# Patient Record
Sex: Female | Born: 1970 | Race: White | Hispanic: No | Marital: Married | Smoking: Never smoker
Health system: Southern US, Community
[De-identification: ages and names within clinical notes are randomized; demographics above are authoritative.]

## PROBLEM LIST (undated history)

## (undated) DIAGNOSIS — I10 Essential (primary) hypertension: Secondary | ICD-10-CM

## (undated) DIAGNOSIS — K5190 Ulcerative colitis, unspecified, without complications: Secondary | ICD-10-CM

## (undated) DIAGNOSIS — D649 Anemia, unspecified: Secondary | ICD-10-CM

## (undated) DIAGNOSIS — K219 Gastro-esophageal reflux disease without esophagitis: Secondary | ICD-10-CM

## (undated) HISTORY — PX: COLONOSCOPY: SHX174

## (undated) HISTORY — PX: ILEOSTOMY CLOSURE: SHX1784

## (undated) HISTORY — PX: TONSILLECTOMY: SUR1361

## (undated) HISTORY — PX: OTHER SURGICAL HISTORY: SHX169

---

## 2001-08-05 HISTORY — PX: OTHER SURGICAL HISTORY: SHX169

## 2005-01-08 ENCOUNTER — Ambulatory Visit: Payer: Self-pay | Admitting: Obstetrics and Gynecology

## 2005-06-06 ENCOUNTER — Ambulatory Visit: Payer: Self-pay | Admitting: Orthopedic Surgery

## 2005-11-22 ENCOUNTER — Ambulatory Visit: Payer: Self-pay | Admitting: Obstetrics and Gynecology

## 2005-11-29 ENCOUNTER — Ambulatory Visit: Payer: Self-pay | Admitting: Obstetrics and Gynecology

## 2007-11-08 ENCOUNTER — Ambulatory Visit: Payer: Self-pay | Admitting: Internal Medicine

## 2008-03-07 ENCOUNTER — Emergency Department: Payer: Self-pay | Admitting: Emergency Medicine

## 2008-03-07 ENCOUNTER — Other Ambulatory Visit: Payer: Self-pay

## 2008-12-09 ENCOUNTER — Ambulatory Visit: Payer: Self-pay | Admitting: Obstetrics and Gynecology

## 2010-01-10 ENCOUNTER — Ambulatory Visit: Payer: Self-pay | Admitting: Obstetrics and Gynecology

## 2011-02-05 ENCOUNTER — Ambulatory Visit: Payer: Self-pay | Admitting: Obstetrics and Gynecology

## 2011-02-08 ENCOUNTER — Ambulatory Visit: Payer: Self-pay | Admitting: Obstetrics and Gynecology

## 2011-04-21 ENCOUNTER — Ambulatory Visit: Payer: Self-pay | Admitting: Family Medicine

## 2012-02-11 ENCOUNTER — Ambulatory Visit: Payer: Self-pay | Admitting: Obstetrics and Gynecology

## 2013-02-16 ENCOUNTER — Ambulatory Visit: Payer: Self-pay | Admitting: Obstetrics and Gynecology

## 2013-06-12 ENCOUNTER — Ambulatory Visit: Payer: Self-pay | Admitting: Physician Assistant

## 2013-06-29 ENCOUNTER — Encounter: Payer: Self-pay | Admitting: Podiatry

## 2013-06-29 ENCOUNTER — Ambulatory Visit (INDEPENDENT_AMBULATORY_CARE_PROVIDER_SITE_OTHER): Payer: BC Managed Care – PPO

## 2013-06-29 ENCOUNTER — Ambulatory Visit (INDEPENDENT_AMBULATORY_CARE_PROVIDER_SITE_OTHER): Payer: BC Managed Care – PPO | Admitting: Podiatry

## 2013-06-29 VITALS — BP 149/83 | HR 80 | Resp 16 | Ht 68.0 in | Wt 170.0 lb

## 2013-06-29 DIAGNOSIS — R52 Pain, unspecified: Secondary | ICD-10-CM

## 2013-06-29 DIAGNOSIS — R609 Edema, unspecified: Secondary | ICD-10-CM

## 2013-06-29 DIAGNOSIS — S93409A Sprain of unspecified ligament of unspecified ankle, initial encounter: Secondary | ICD-10-CM

## 2013-06-29 NOTE — Patient Instructions (Signed)
Keep unna boot on for 4 days and then remove and use ace wrap at night

## 2013-06-29 NOTE — Progress Notes (Signed)
  Subjective:    Patient ID: Amber Blair, female    DOB: 12-Jul-1971, 42 y.o.   MRN: [id number]  HPI Comments: N swelling sore  L left lateral side  D 2 1/2 weeks  O dropped speaker on leg and it went down foot  C no better  A  T no treatment   Foot Injury       Review of Systems  All other systems reviewed and are negative.       Objective:   Physical Exam        Assessment & Plan:

## 2013-06-30 NOTE — Progress Notes (Signed)
Subjective:     Patient ID: Amber Blair, female   DOB: 22-Jul-1971, 42 y.o.   MRN: [id number]  Foot Injury    patient presents stating I dropped a large stereo speaker on my left foot and ankle 2 weeks ago and might ankle has been swollen and painful with ambulation. Went to urgent care who stated no fracture   Review of Systems  All other systems reviewed and are negative.       Objective:   Physical Exam  Nursing note and vitals reviewed. Constitutional: She is oriented to person, place, and time.  Cardiovascular: Intact distal pulses.   Musculoskeletal: Normal range of motion.  Neurological: She is oriented to person, place, and time.  Skin: Skin is warm.   patient is found to have normal neurovascular status with edema in the left ankle and lower leg +2 pitting in nature. I was able to put her through range of motion with only mild discomfort and there was a negative Homans sign noted. Muscle strength appears adequate with some splinting on the left side     Assessment:     Severe ankle sprain with trauma that has occurred to the left ankle    Plan:     Multiple view x-rays of foot and ankle reviewed and today Unna boot was applied with Ace wrap and surgical shoe. Levon for 4 days and less it should become too tight were she will take it off and begin Ace wrap usage. Patient to be seen back if symptoms persist or if swelling does not reduce

## 2014-01-16 ENCOUNTER — Ambulatory Visit: Payer: Self-pay | Admitting: Physician Assistant

## 2014-03-09 ENCOUNTER — Ambulatory Visit: Payer: Self-pay | Admitting: Obstetrics and Gynecology

## 2015-03-13 IMAGING — CR DG TIBIA/FIBULA 2V*L*
1 series · 2 of 2 positions shown · non-contrast
Comparison: None.

CLINICAL DATA: Pain/bruising to distal tib-fib

EXAM:
LEFT TIBIA AND FIBULA - 2 VIEW

[Series 1: ap · 0.17mm/px · 2 of 2 slices shown]
[im 1/2]
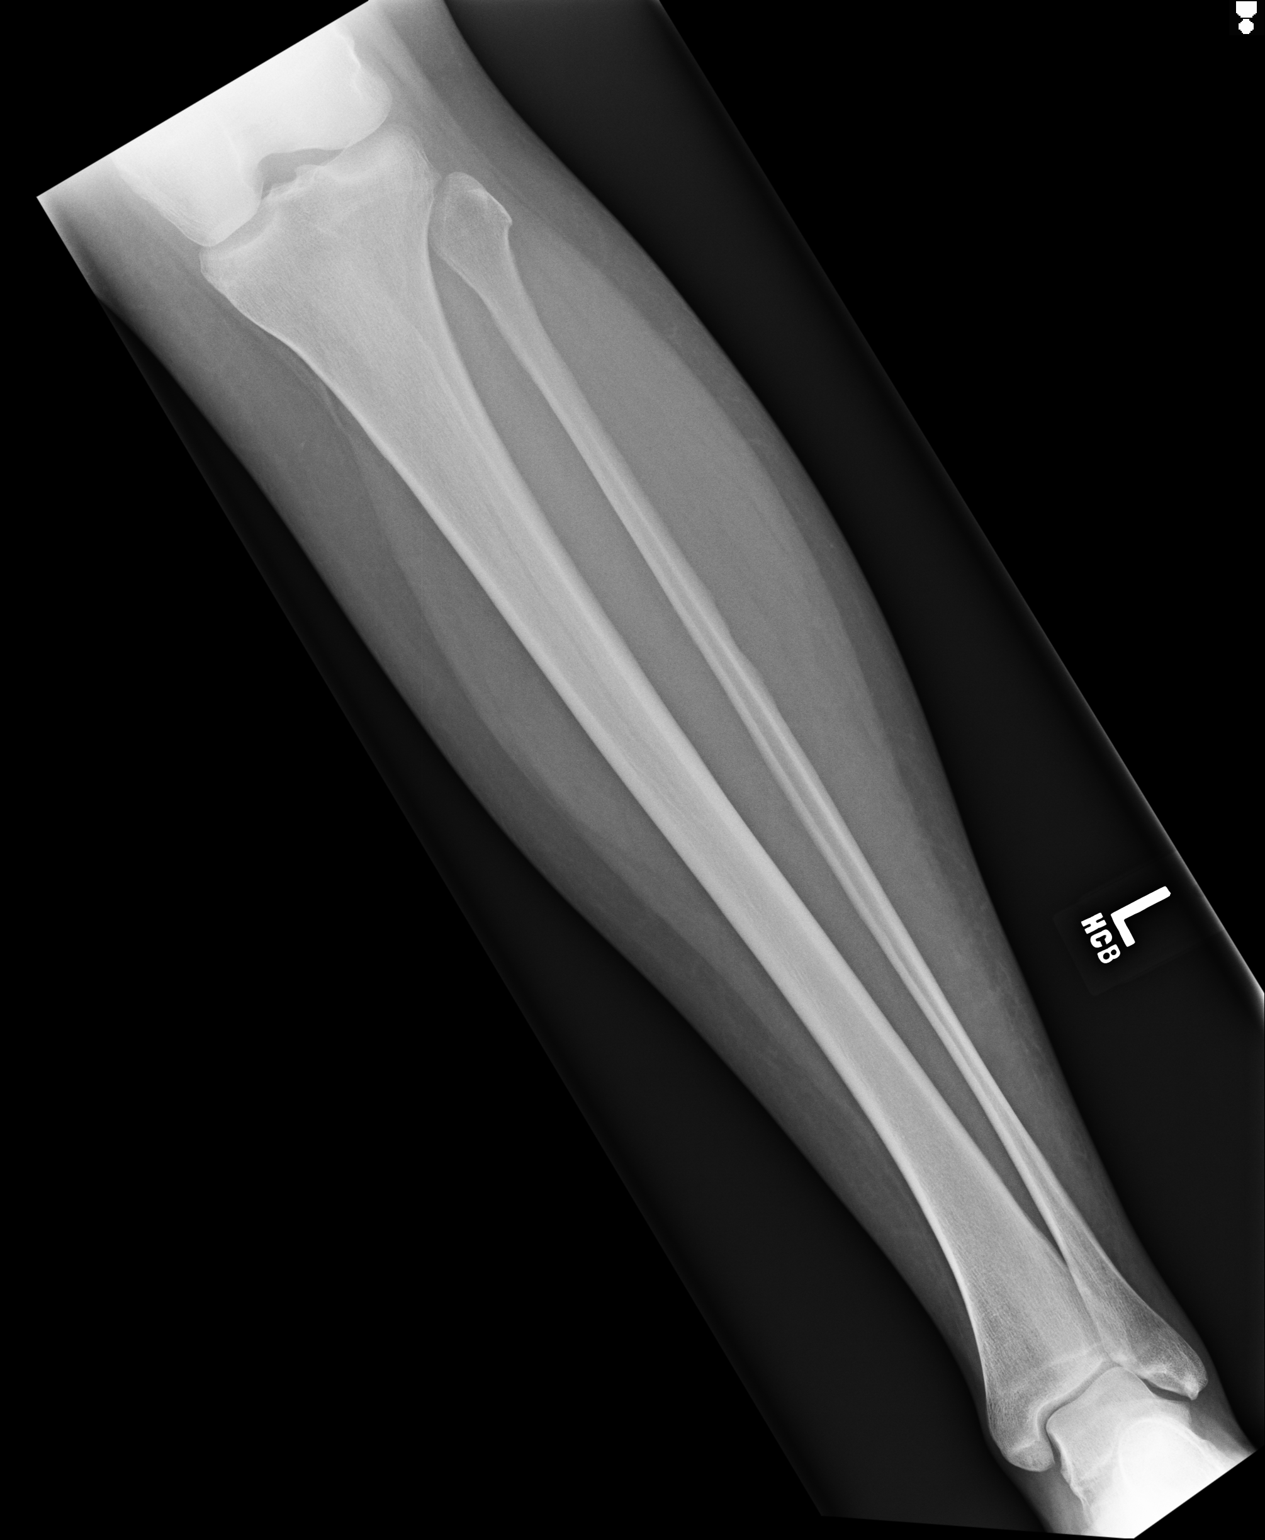
[im 2/2]
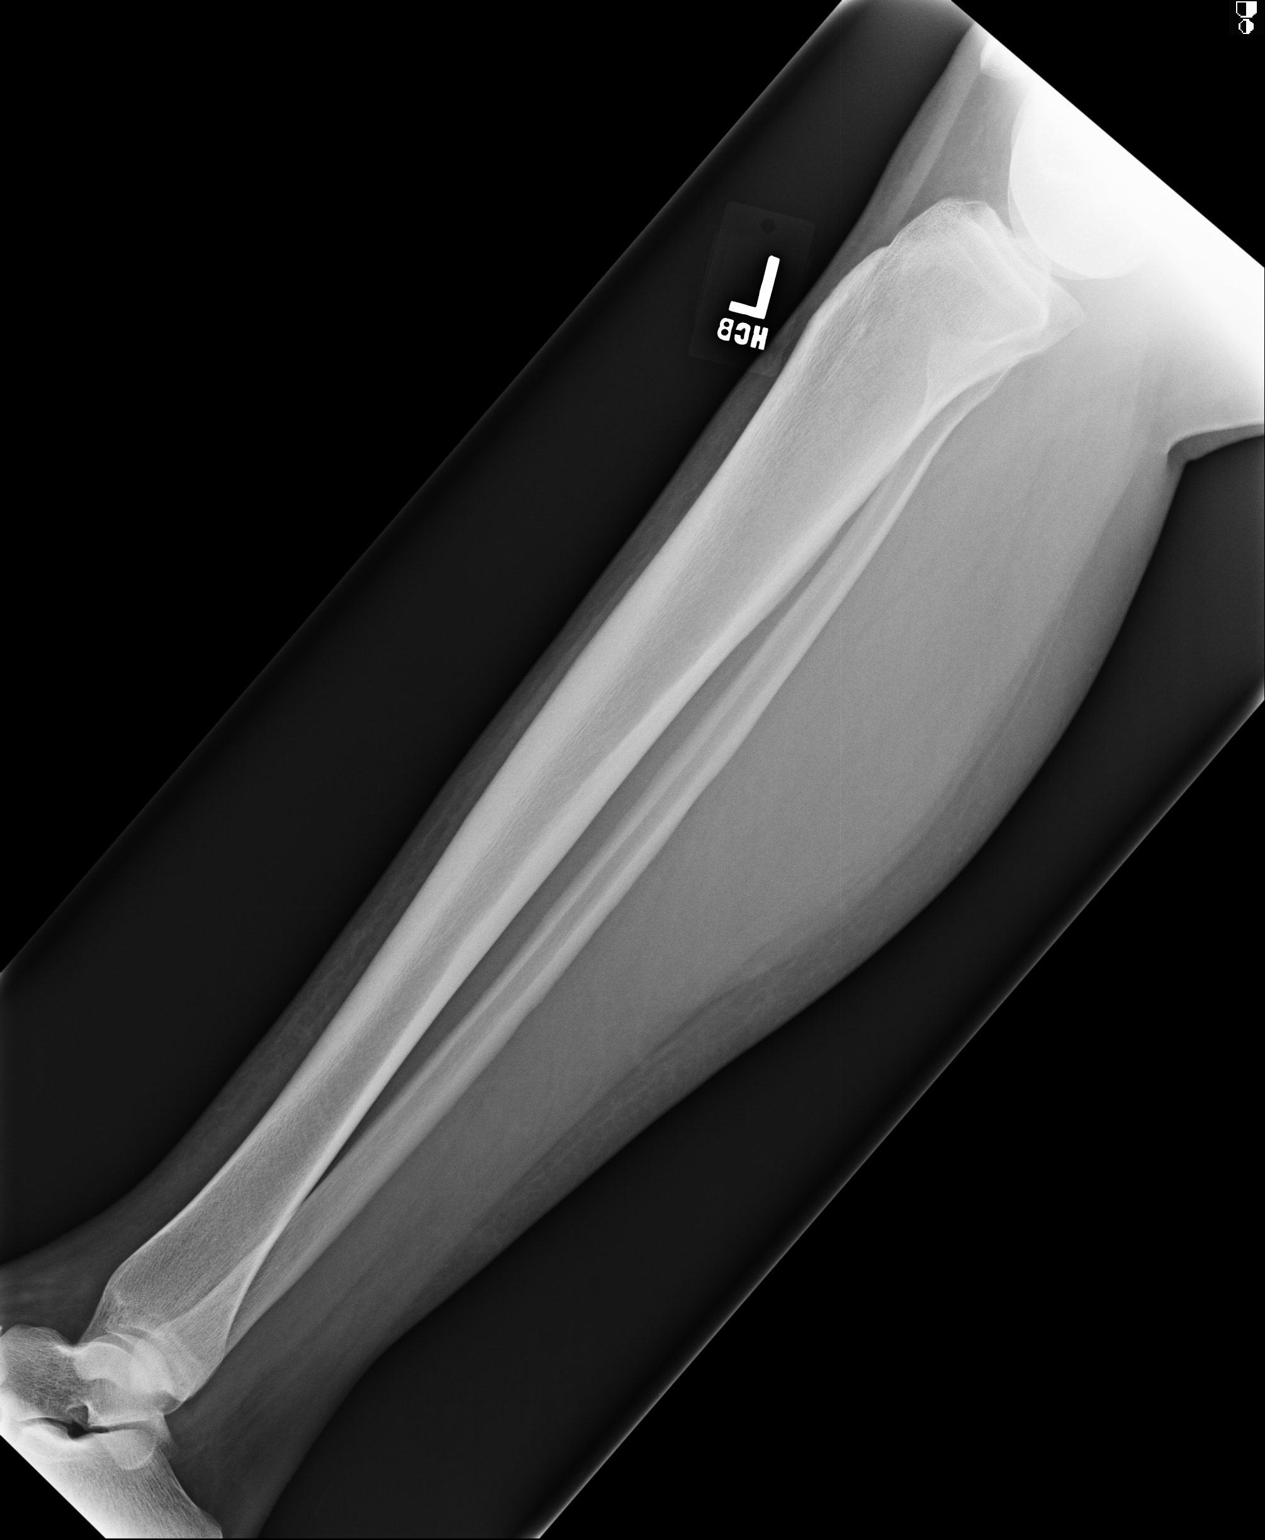

[2 of 2 positions shown; findings below may reference images not displayed]

FINDINGS: No fracture or dislocation is seen.

The joint spaces are preserved.

The visualized soft tissues are unremarkable.
IMPRESSION: No fracture or dislocation is seen.

## 2015-04-04 ENCOUNTER — Other Ambulatory Visit: Payer: Self-pay | Admitting: Obstetrics and Gynecology

## 2015-04-04 DIAGNOSIS — Z1231 Encounter for screening mammogram for malignant neoplasm of breast: Secondary | ICD-10-CM

## 2015-04-05 ENCOUNTER — Ambulatory Visit
Admission: RE | Admit: 2015-04-05 | Discharge: 2015-04-05 | Disposition: A | Discharge status: Home / Self Care | Payer: BLUE CROSS/BLUE SHIELD | Source: Ambulatory Visit | Attending: Obstetrics and Gynecology | Admitting: Obstetrics and Gynecology

## 2015-04-05 ENCOUNTER — Other Ambulatory Visit: Payer: Self-pay | Admitting: Obstetrics and Gynecology

## 2015-04-05 DIAGNOSIS — Z1231 Encounter for screening mammogram for malignant neoplasm of breast: Secondary | ICD-10-CM | POA: Insufficient documentation

## 2016-04-09 ENCOUNTER — Other Ambulatory Visit: Payer: Self-pay | Admitting: Obstetrics and Gynecology

## 2016-04-09 DIAGNOSIS — Z1231 Encounter for screening mammogram for malignant neoplasm of breast: Secondary | ICD-10-CM

## 2016-04-25 ENCOUNTER — Ambulatory Visit
Admission: RE | Admit: 2016-04-25 | Discharge: 2016-04-25 | Disposition: A | Discharge status: Home / Self Care | Payer: BLUE CROSS/BLUE SHIELD | Source: Ambulatory Visit | Attending: Obstetrics and Gynecology | Admitting: Obstetrics and Gynecology

## 2016-04-25 DIAGNOSIS — Z1231 Encounter for screening mammogram for malignant neoplasm of breast: Secondary | ICD-10-CM | POA: Insufficient documentation

## 2017-04-15 ENCOUNTER — Other Ambulatory Visit: Payer: Self-pay | Admitting: Obstetrics and Gynecology

## 2017-04-15 DIAGNOSIS — Z1231 Encounter for screening mammogram for malignant neoplasm of breast: Secondary | ICD-10-CM

## 2017-05-05 ENCOUNTER — Ambulatory Visit
Admission: RE | Admit: 2017-05-05 | Discharge: 2017-05-05 | Disposition: A | Discharge status: Home / Self Care | Payer: BLUE CROSS/BLUE SHIELD | Source: Ambulatory Visit | Attending: Obstetrics and Gynecology | Admitting: Obstetrics and Gynecology

## 2017-05-05 DIAGNOSIS — Z1231 Encounter for screening mammogram for malignant neoplasm of breast: Secondary | ICD-10-CM | POA: Insufficient documentation

## 2017-10-01 ENCOUNTER — Other Ambulatory Visit: Payer: Self-pay | Admitting: Unknown Physician Specialty

## 2017-10-01 DIAGNOSIS — M542 Cervicalgia: Secondary | ICD-10-CM

## 2017-10-06 ENCOUNTER — Ambulatory Visit
Admission: RE | Admit: 2017-10-06 | Discharge: 2017-10-06 | Disposition: A | Discharge status: Home / Self Care | Payer: BLUE CROSS/BLUE SHIELD | Source: Ambulatory Visit | Attending: Unknown Physician Specialty | Admitting: Unknown Physician Specialty

## 2017-10-06 DIAGNOSIS — M542 Cervicalgia: Secondary | ICD-10-CM

## 2017-10-06 DIAGNOSIS — R590 Localized enlarged lymph nodes: Secondary | ICD-10-CM | POA: Insufficient documentation

## 2017-10-30 ENCOUNTER — Telehealth: Payer: Self-pay

## 2017-10-31 NOTE — Telephone Encounter (Signed)
Duplicate entry

## 2018-04-21 ENCOUNTER — Other Ambulatory Visit: Payer: Self-pay | Admitting: Obstetrics and Gynecology

## 2018-04-21 DIAGNOSIS — Z1231 Encounter for screening mammogram for malignant neoplasm of breast: Secondary | ICD-10-CM

## 2018-05-06 ENCOUNTER — Ambulatory Visit: Payer: BLUE CROSS/BLUE SHIELD

## 2018-05-07 ENCOUNTER — Ambulatory Visit
Admission: RE | Admit: 2018-05-07 | Discharge: 2018-05-07 | Disposition: A | Discharge status: Home / Self Care | Payer: BLUE CROSS/BLUE SHIELD | Source: Ambulatory Visit | Attending: Obstetrics and Gynecology | Admitting: Obstetrics and Gynecology

## 2018-05-07 DIAGNOSIS — Z1231 Encounter for screening mammogram for malignant neoplasm of breast: Secondary | ICD-10-CM | POA: Insufficient documentation

## 2018-05-13 ENCOUNTER — Other Ambulatory Visit: Payer: Self-pay | Admitting: Obstetrics and Gynecology

## 2018-05-13 DIAGNOSIS — R928 Other abnormal and inconclusive findings on diagnostic imaging of breast: Secondary | ICD-10-CM

## 2018-05-13 DIAGNOSIS — N6310 Unspecified lump in the right breast, unspecified quadrant: Secondary | ICD-10-CM

## 2018-05-20 ENCOUNTER — Ambulatory Visit
Admission: RE | Admit: 2018-05-20 | Discharge: 2018-05-20 | Disposition: A | Discharge status: Home / Self Care | Payer: BLUE CROSS/BLUE SHIELD | Source: Ambulatory Visit | Attending: Obstetrics and Gynecology | Admitting: Obstetrics and Gynecology

## 2018-05-20 ENCOUNTER — Other Ambulatory Visit: Payer: Self-pay | Admitting: Obstetrics and Gynecology

## 2018-05-20 DIAGNOSIS — N6310 Unspecified lump in the right breast, unspecified quadrant: Secondary | ICD-10-CM

## 2018-05-20 DIAGNOSIS — R928 Other abnormal and inconclusive findings on diagnostic imaging of breast: Secondary | ICD-10-CM

## 2018-08-06 ENCOUNTER — Other Ambulatory Visit: Payer: Self-pay | Admitting: Obstetrics and Gynecology

## 2018-08-06 DIAGNOSIS — N6310 Unspecified lump in the right breast, unspecified quadrant: Secondary | ICD-10-CM

## 2018-08-26 ENCOUNTER — Ambulatory Visit
Admission: RE | Admit: 2018-08-26 | Discharge: 2018-08-26 | Disposition: A | Discharge status: Home / Self Care | Payer: PRIVATE HEALTH INSURANCE | Source: Ambulatory Visit | Attending: Obstetrics and Gynecology | Admitting: Obstetrics and Gynecology

## 2018-08-26 DIAGNOSIS — N6310 Unspecified lump in the right breast, unspecified quadrant: Secondary | ICD-10-CM | POA: Insufficient documentation

## 2018-09-08 ENCOUNTER — Telehealth: Payer: Self-pay

## 2018-09-08 NOTE — Telephone Encounter (Signed)
Spoke with patient reference appointment 09/24/2018. Patient states she had called to cancel this appointment she is currently working with her PCP to lower her BP. She will call if she needs another appointment with this office.  Please cancel appointment for 09/24/2018 per patient.

## 2018-09-09 NOTE — Telephone Encounter (Signed)
Cancelled appt

## 2018-09-24 ENCOUNTER — Ambulatory Visit: Payer: BLUE CROSS/BLUE SHIELD | Admitting: Cardiovascular Disease

## 2018-10-04 ENCOUNTER — Observation Stay
Admission: EM | Admit: 2018-10-04 | Discharge: 2018-10-05 | DRG: 392 | Disposition: A | Discharge status: Home / Self Care | Payer: PRIVATE HEALTH INSURANCE | Attending: Internal Medicine | Admitting: Internal Medicine

## 2018-10-04 ENCOUNTER — Other Ambulatory Visit: Payer: Self-pay

## 2018-10-04 DIAGNOSIS — N179 Acute kidney failure, unspecified: Secondary | ICD-10-CM | POA: Diagnosis not present

## 2018-10-04 DIAGNOSIS — K529 Noninfective gastroenteritis and colitis, unspecified: Secondary | ICD-10-CM

## 2018-10-04 DIAGNOSIS — E860 Dehydration: Secondary | ICD-10-CM | POA: Diagnosis present

## 2018-10-04 DIAGNOSIS — I10 Essential (primary) hypertension: Secondary | ICD-10-CM | POA: Diagnosis present

## 2018-10-04 DIAGNOSIS — R197 Diarrhea, unspecified: Secondary | ICD-10-CM | POA: Diagnosis present

## 2018-10-04 DIAGNOSIS — A0811 Acute gastroenteropathy due to Norwalk agent: Principal | ICD-10-CM | POA: Diagnosis present

## 2018-10-04 DIAGNOSIS — K5190 Ulcerative colitis, unspecified, without complications: Secondary | ICD-10-CM | POA: Diagnosis not present

## 2018-10-04 HISTORY — DX: Ulcerative colitis, unspecified, without complications: K51.90

## 2018-10-04 HISTORY — DX: Essential (primary) hypertension: I10

## 2018-10-04 LAB — GASTROINTESTINAL PANEL BY PCR, STOOL (REPLACES STOOL CULTURE)
Adenovirus F40/41: NOT DETECTED
Astrovirus: NOT DETECTED
CYCLOSPORA CAYETANENSIS: NOT DETECTED
Campylobacter species: NOT DETECTED
Cryptosporidium: NOT DETECTED
Entamoeba histolytica: NOT DETECTED
Enteroaggregative E coli (EAEC): NOT DETECTED
Enteropathogenic E coli (EPEC): NOT DETECTED
Enterotoxigenic E coli (ETEC): NOT DETECTED
Giardia lamblia: NOT DETECTED
Norovirus GI/GII: DETECTED — AB
Plesimonas shigelloides: NOT DETECTED
Rotavirus A: NOT DETECTED
Salmonella species: NOT DETECTED
Sapovirus (I, II, IV, and V): NOT DETECTED
Shiga like toxin producing E coli (STEC): NOT DETECTED
Shigella/Enteroinvasive E coli (EIEC): NOT DETECTED
VIBRIO CHOLERAE: NOT DETECTED
Vibrio species: NOT DETECTED
Yersinia enterocolitica: NOT DETECTED

## 2018-10-04 LAB — COMPREHENSIVE METABOLIC PANEL
ALT: 27 U/L (ref 0–44)
AST: 27 U/L (ref 15–41)
Albumin: 4.7 g/dL (ref 3.5–5.0)
Alkaline Phosphatase: 60 U/L (ref 38–126)
Anion gap: 16 — ABNORMAL HIGH (ref 5–15)
BUN: 41 mg/dL — ABNORMAL HIGH (ref 6–20)
CO2: 18 mmol/L — ABNORMAL LOW (ref 22–32)
CREATININE: 2.44 mg/dL — AB (ref 0.44–1.00)
Calcium: 9.6 mg/dL (ref 8.9–10.3)
Chloride: 98 mmol/L (ref 98–111)
GFR calc Af Amer: 26 mL/min — ABNORMAL LOW (ref >60)
GFR calc non Af Amer: 22 mL/min — ABNORMAL LOW (ref >60)
Glucose, Bld: 168 mg/dL — ABNORMAL HIGH (ref 70–99)
Potassium: 3.6 mmol/L (ref 3.5–5.1)
Sodium: 132 mmol/L — ABNORMAL LOW (ref 135–145)
Total Bilirubin: 0.4 mg/dL (ref 0.3–1.2)
Total Protein: 8.8 g/dL — ABNORMAL HIGH (ref 6.5–8.1)

## 2018-10-04 LAB — URINALYSIS, COMPLETE (UACMP) WITH MICROSCOPIC
Bilirubin Urine: NEGATIVE
Glucose, UA: NEGATIVE mg/dL
Ketones, ur: NEGATIVE mg/dL
NITRITE: NEGATIVE
Protein, ur: 30 mg/dL — AB
SPECIFIC GRAVITY, URINE: 1.012 (ref 1.005–1.030)
WBC, UA: >50 WBC/hpf — ABNORMAL HIGH (ref 0–5)
pH: 5 (ref 5.0–8.0)

## 2018-10-04 LAB — C DIFFICILE QUICK SCREEN W PCR REFLEX
C Diff antigen: NEGATIVE
C Diff interpretation: NOT DETECTED
C Diff toxin: NEGATIVE

## 2018-10-04 LAB — CBC
HCT: 51.3 % — ABNORMAL HIGH (ref 36.0–46.0)
Hemoglobin: 16.9 g/dL — ABNORMAL HIGH (ref 12.0–15.0)
MCH: 29.7 pg (ref 26.0–34.0)
MCHC: 32.9 g/dL (ref 30.0–36.0)
MCV: 90.2 fL (ref 80.0–100.0)
PLATELETS: 342 10*3/uL (ref 150–400)
RBC: 5.69 MIL/uL — ABNORMAL HIGH (ref 3.87–5.11)
RDW: 12.9 % (ref 11.5–15.5)
WBC: 11.1 10*3/uL — ABNORMAL HIGH (ref 4.0–10.5)
nRBC: 0 % (ref 0.0–0.2)

## 2018-10-04 LAB — LIPASE, BLOOD: Lipase: 33 U/L (ref 11–51)

## 2018-10-04 MED ORDER — POLYETHYLENE GLYCOL 3350 17 G PO PACK
17.0000 g | PACK | Freq: Every day | ORAL | Status: DC | PRN
  Filled 2018-10-04: qty 1

## 2018-10-04 MED ORDER — ACETAMINOPHEN 650 MG RE SUPP
650.0000 mg | Freq: Four times a day (QID) | RECTAL | Status: DC | PRN
  Filled 2018-10-04: qty 1

## 2018-10-04 MED ORDER — POTASSIUM CHLORIDE IN NACL 20-0.9 MEQ/L-% IV SOLN
INTRAVENOUS | Status: DC
  Administered 2018-10-04 – 2018-10-05 (×2): via INTRAVENOUS
  Filled 2018-10-04 (×5): qty 1000

## 2018-10-04 MED ORDER — HEPARIN SODIUM (PORCINE) 5000 UNIT/ML IJ SOLN
5000.0000 [IU] | Freq: Three times a day (TID) | INTRAMUSCULAR | Status: DC
  Administered 2018-10-04 – 2018-10-05 (×3): 5000 [IU] via SUBCUTANEOUS
  Filled 2018-10-04 (×6): qty 1

## 2018-10-04 MED ORDER — ONDANSETRON HCL 4 MG/2ML IJ SOLN
4.0000 mg | Freq: Once | INTRAMUSCULAR | Status: AC
  Administered 2018-10-04: 4 mg via INTRAVENOUS
  Filled 2018-10-04: qty 2

## 2018-10-04 MED ORDER — SODIUM CHLORIDE 0.9 % IV SOLN
Freq: Once | INTRAVENOUS | Status: AC
  Administered 2018-10-04: 16:00:00 via INTRAVENOUS

## 2018-10-04 MED ORDER — ACETAMINOPHEN 325 MG PO TABS
650.0000 mg | ORAL_TABLET | Freq: Four times a day (QID) | ORAL | Status: DC | PRN
  Filled 2018-10-04: qty 2

## 2018-10-04 MED ORDER — SODIUM CHLORIDE 0.9% FLUSH: 3.0000 mL | Freq: Once | INTRAVENOUS | Status: DC

## 2018-10-04 MED ORDER — ONDANSETRON HCL 4 MG PO TABS
4.0000 mg | ORAL_TABLET | Freq: Four times a day (QID) | ORAL | Status: DC | PRN
  Filled 2018-10-04: qty 1

## 2018-10-04 MED ORDER — ONDANSETRON HCL 4 MG/2ML IJ SOLN: 4.0000 mg | Freq: Four times a day (QID) | INTRAMUSCULAR | Status: DC | PRN

## 2018-10-04 MED ORDER — SODIUM CHLORIDE 0.9 % IV SOLN
Freq: Once | INTRAVENOUS | Status: AC
  Administered 2018-10-04: 18:00:00 via INTRAVENOUS

## 2018-10-04 NOTE — ED Provider Notes (Signed)
[facility] Provider Note       Time seen: ----------------------------------------- 4:18 PM on 10/04/2018 -----------------------------------------   I have reviewed the triage vital signs and the nursing notes.  HISTORY   Chief Complaint Emesis and Diarrhea    HPI Amber Blair is a 48 y.o. female with a history of hypertension and ulcerative colitis who presents to the ED for persistent vomiting and diarrhea that started on Friday.  She states she has not vomited since Friday but has had profuse diarrhea with one episode around every hour.  She is concerned she may have food poisoning.  She does ache all over and cramp, is concerned she is dehydrated.  She also feels near syncopal.  Past Medical History:  Diagnosis Date  . Hypertension   . Ulcerative colitis (HCC)     There are no active problems to display for this patient.   Past Surgical History:  Procedure Laterality Date  . large intestine removed      Allergies Patient has no known allergies.  Social History Social History   Tobacco Use  . Smoking status: Never Smoker  . Smokeless tobacco: Never Used  Substance Use Topics  . Alcohol use: No  . Drug use: No   Review of Systems Constitutional: Negative for fever. Cardiovascular: Negative for chest pain. Respiratory: Negative for shortness of breath. Gastrointestinal: Negative for abdominal pain, positive for recent vomiting, severe diarrhea Musculoskeletal: Negative for back pain. Skin: Negative for rash. Neurological: Positive for weakness  All systems negative/normal/unremarkable except as stated in the HPI  ____________________________________________   PHYSICAL EXAM:  VITAL SIGNS: ED Triage Vitals  Enc Vitals Group     BP 10/04/18 1431 (!) 104/54     Pulse Rate 10/04/18 1431 (!) 129     Resp --      Temp --      Temp src --      SpO2 10/04/18 1431 100 %     Weight 10/04/18 1432 170  lb (77.1 kg)     Height 10/04/18 1432 5\' 8"  (1.727 m)     Head Circumference --      Peak Flow --      Pain Score 10/04/18 1446 7     Pain Loc --      Pain Edu? --      Excl. in GC? --    Constitutional: Alert and oriented.  Mild distress Eyes: Conjunctivae are normal. Normal extraocular movements. ENT      Head: Normocephalic and atraumatic.      Nose: No congestion/rhinnorhea.      Mouth/Throat: Mucous membranes are dry      Neck: No stridor. Cardiovascular: Normal rate, regular rhythm. No murmurs, rubs, or gallops. Respiratory: Normal respiratory effort without tachypnea nor retractions. Breath sounds are clear and equal bilaterally. No wheezes/rales/rhonchi. Gastrointestinal: Soft and nontender. Normal bowel sounds Musculoskeletal: Nontender with normal range of motion in extremities. No lower extremity tenderness nor edema. Neurologic:  Normal speech and language. No gross focal neurologic deficits are appreciated.  Skin:  Skin is warm, dry and intact. No rash noted. Psychiatric: Mood and affect are normal. Speech and behavior are normal.  ____________________________________________  ED COURSE:  As part of my medical decision making, I reviewed the following data within the electronic MEDICAL RECORD NUMBER History obtained from family if available, nursing notes, old chart and ekg, as well as notes from prior ED visits. Patient presented for likely gastroenteritis and dehydration, we will assess  with labs and imaging as indicated at this time.   Procedures ____________________________________________   LABS (pertinent positives/negatives)  Labs Reviewed  COMPREHENSIVE METABOLIC PANEL - Abnormal; Notable for the following components:      Result Value   Sodium 132 (*)    CO2 18 (*)    Glucose, Bld 168 (*)    BUN 41 (*)    Creatinine, Ser 2.44 (*)    Total Protein 8.8 (*)    GFR calc non Af Amer 22 (*)    GFR calc Af Amer 26 (*)    Anion gap 16 (*)    All other  components within normal limits  CBC - Abnormal; Notable for the following components:   WBC 11.1 (*)    RBC 5.69 (*)    Hemoglobin 16.9 (*)    HCT 51.3 (*)    All other components within normal limits  GASTROINTESTINAL PANEL BY PCR, STOOL (REPLACES STOOL CULTURE)  C DIFFICILE QUICK SCREEN W PCR REFLEX  LIPASE, BLOOD  URINALYSIS, COMPLETE (UACMP) WITH MICROSCOPIC   ____________________________________________   DIFFERENTIAL DIAGNOSIS   Dehydration, electrolyte abnormality, gastroenteritis, renal failure  FINAL ASSESSMENT AND PLAN  Gastroenteritis, acute renal failure   Plan: The patient had presented for persistent diarrhea and dehydration. Patient's labs did indicate significant dehydration and acute renal failure likely from same.  Patient was started on 2 L of IV fluid as well as IV antiemetics.  She is not currently having any pain except for cramping from dehydration.  I will discuss with the hospitalist for admission.   Amber Dash, MD    Note: This note was generated in part or whole with voice recognition software. Voice recognition is usually quite accurate but there are transcription errors that can and very often do occur. I apologize for any typographical errors that were not detected and corrected.     Emily Filbert, MD 10/04/18 1620

## 2018-10-04 NOTE — H&P (Signed)
[facility] [place] NAME: Amber Blair    MR#:  [id number]  DATE OF BIRTH:  10-10-1970  DATE OF ADMISSION:  10/04/2018  PRIMARY CARE PHYSICIAN: Dayton Martes Victoriano Lain, NP   REQUESTING/REFERRING PHYSICIAN:  Dr Jimmye Norman  CHIEF COMPLAINT:   diarrhea HISTORY OF PRESENT ILLNESS:  Amber Blair  is a 48 y.o. female with a known history of hypertension who presents emergency room due to diarrhea.  Patient reports over the past 2 days she has had diarrhea, nausea and vomiting.  She felt very dehydrated and was having leg cramps so she comes to the ER for further evaluation.  In the emergency room it is noted that his creatinine is elevated.  Patient reports that she was in the hospital at [facility] with her daughter over the weekend.  She reports no antibiotics, fever, chills.  She denies abdominal pain.  No sick contacts noted.  No recent travel history.    PAST MEDICAL HISTORY:   Past Medical History:  Diagnosis Date  . Hypertension   . Ulcerative colitis ([place])     PAST SURGICAL HISTORY:   Past Surgical History:  Procedure Laterality Date  . large intestine removed      SOCIAL HISTORY:   Social History   Tobacco Use  . Smoking status: Never Smoker  . Smokeless tobacco: Never Used  Substance Use Topics  . Alcohol use: No    FAMILY HISTORY:   Family History  Problem Relation Age of Onset  . Breast cancer Paternal Grandmother 11    DRUG ALLERGIES:  No Known Allergies  REVIEW OF SYSTEMS:   Review of Systems  Constitutional: Positive for malaise/fatigue. Negative for chills and fever.  HENT: Negative.  Negative for ear discharge, ear pain, hearing loss, nosebleeds and sore throat.   Eyes: Negative.  Negative for blurred vision and pain.  Respiratory: Negative.  Negative for cough, hemoptysis, shortness of breath and wheezing.   Cardiovascular: Negative.  Negative for chest pain, palpitations and leg swelling.   Gastrointestinal: Positive for diarrhea, nausea and vomiting. Negative for abdominal pain and blood in stool.  Genitourinary: Negative.  Negative for dysuria.  Musculoskeletal: Negative.  Negative for back pain.  Skin: Negative.   Neurological: Negative for dizziness, tremors, speech change, focal weakness, seizures and headaches.  Endo/Heme/Allergies: Negative.  Does not bruise/bleed easily.  Psychiatric/Behavioral: Negative.  Negative for depression, hallucinations and suicidal ideas.    MEDICATIONS AT HOME:   Losartan 100 mg daily HCTZ 12.5 mg daily Vitamin D3 5000 units daily Birth control  VITAL SIGNS:  Blood pressure (!) 104/54, pulse (!) 111, height 5' 8"  (1.727 m), weight 77.1 kg, SpO2 100 %.  PHYSICAL EXAMINATION:   Physical Exam Constitutional:      General: She is not in acute distress. HENT:     Head: Normocephalic.     Mouth/Throat:     Mouth: Mucous membranes are dry.  Eyes:     General: No scleral icterus. Neck:     Musculoskeletal: Normal range of motion and neck supple.     Vascular: No JVD.     Trachea: No tracheal deviation.  Cardiovascular:     Rate and Rhythm: Normal rate and regular rhythm.     Heart sounds: Normal heart sounds. No murmur. No friction rub. No gallop.   Pulmonary:     Effort: Pulmonary effort is normal. No respiratory distress.     Breath sounds: Normal breath sounds. No wheezing or rales.  Chest:     Chest wall: No tenderness.  Abdominal:     General: Bowel sounds are normal. There is no distension.     Palpations: Abdomen is soft. There is no mass.     Tenderness: There is no abdominal tenderness. There is no guarding or rebound.  Musculoskeletal: Normal range of motion.  Skin:    General: Skin is warm.     Findings: No erythema or rash.  Neurological:     Mental Status: She is alert and oriented to person, place, and time.  Psychiatric:        Judgment: Judgment normal.       LABORATORY PANEL:   CBC Recent Labs   Lab 10/04/18 1449  WBC 11.1*  HGB 16.9*  HCT 51.3*  PLT 342   ------------------------------------------------------------------------------------------------------------------  Chemistries  Recent Labs  Lab 10/04/18 1449  NA 132*  K 3.6  CL 98  CO2 18*  GLUCOSE 168*  BUN 41*  CREATININE 2.44*  CALCIUM 9.6  AST 27  ALT 27  ALKPHOS 60  BILITOT 0.4   ------------------------------------------------------------------------------------------------------------------  Cardiac Enzymes No results for input(s): TROPONINI in the last 168 hours. ------------------------------------------------------------------------------------------------------------------  RADIOLOGY:  No results found.  EKG:   Orders placed or performed in visit on 03/07/08  . EKG 12-Lead    IMPRESSION AND PLAN:   48 year old female with a history of hypertension who presents with diarrhea.  1.  Acute kidney injury in the setting of diarrhea: Start IV fluids BMP for a.m. Hold losartan and HCTZ as these have renal effects.  2.  Essential hypertension: Hold blood pressure medications for now as these will affect kidney function  3.  Diarrhea with nausea and vomiting: Likely viral in nature GI panel pending C. difficile pending      All the records are reviewed and case discussed with ED provider. Management plans discussed with the patient and she is in agreement  CODE STATUS: full  TOTAL TIME TAKING CARE OF THIS PATIENT: 41 minutes.    Rosita Guzzetta M.D on 10/04/2018 at 4:33 PM  Between 7am to 6pm - Pager - [phone]  After 6pm go to [email or web address] - password [email or web address]  Office  [phone]  CC: Primary care physician; Dayton Martes Victoriano Lain, NP

## 2018-10-04 NOTE — ED Triage Notes (Addendum)
Pt comes via POV from home with c/o vomiting and diarrhea that started Friday night. Pt states possible food poisoning. Pt states this all started not long after eating.  Pt states she aches all over. Pt states hot flashes and cramps in her feet. Pt states she feels like she is going to pass out.  Pt states last vomiting Friday night. Pt states decreased oral intake.  Pt also states she was recently at [facility] with daughter and spent the night and could have been exposed to something there.

## 2018-10-05 LAB — CBC
HCT: 44.4 % (ref 36.0–46.0)
Hemoglobin: 14.2 g/dL (ref 12.0–15.0)
MCH: 29.7 pg (ref 26.0–34.0)
MCHC: 32 g/dL (ref 30.0–36.0)
MCV: 92.9 fL (ref 80.0–100.0)
Platelets: 300 10*3/uL (ref 150–400)
RBC: 4.78 MIL/uL (ref 3.87–5.11)
RDW: 13.1 % (ref 11.5–15.5)
WBC: 7.8 10*3/uL (ref 4.0–10.5)
nRBC: 0 % (ref 0.0–0.2)

## 2018-10-05 LAB — BASIC METABOLIC PANEL
Anion gap: 7 (ref 5–15)
BUN: 32 mg/dL — AB (ref 6–20)
CO2: 24 mmol/L (ref 22–32)
Calcium: 8.2 mg/dL — ABNORMAL LOW (ref 8.9–10.3)
Chloride: 107 mmol/L (ref 98–111)
Creatinine, Ser: 1.19 mg/dL — ABNORMAL HIGH (ref 0.44–1.00)
GFR calc Af Amer: >60 mL/min (ref >60)
GFR calc non Af Amer: 54 mL/min — ABNORMAL LOW (ref >60)
Glucose, Bld: 100 mg/dL — ABNORMAL HIGH (ref 70–99)
Potassium: 3.5 mmol/L (ref 3.5–5.1)
Sodium: 138 mmol/L (ref 135–145)

## 2018-10-05 MED ORDER — ONDANSETRON HCL 4 MG PO TABS: 4.0000 mg | ORAL_TABLET | Freq: Four times a day (QID) | ORAL | 0 refills | Status: DC | PRN

## 2018-10-05 MED ORDER — VITAMIN D3 25 MCG (1000 UNIT) PO TABS
5000.0000 [IU] | ORAL_TABLET | Freq: Every day | ORAL | Status: DC
  Filled 2018-10-05: qty 5

## 2018-10-05 NOTE — ED Notes (Signed)
Patient in bathroom at this time.

## 2018-10-05 NOTE — Progress Notes (Signed)
MD notified of GI panel results. Continue Contact precautions '

## 2018-10-05 NOTE — Discharge Summary (Signed)
[facility] [place] at [facility]   PATIENT NAME: Amber Blair    MR#:  [id number]  DATE OF BIRTH:  [birth year]  DATE OF ADMISSION:  10/04/2018   ADMITTING PHYSICIAN: Adrian Saran, MD  DATE OF DISCHARGE: 10/05/2018  3:00 PM  PRIMARY CARE PHYSICIAN: Bayard Males Hermenia Fiscal, NP   ADMISSION DIAGNOSIS:  dehydrated diarrhea DISCHARGE DIAGNOSIS:  Active Problems:   Acute kidney injury (HCC)  SECONDARY DIAGNOSIS:   Past Medical History:  Diagnosis Date  . Hypertension   . Ulcerative colitis [facility])    HOSPITAL COURSE:   Assetou is a 48 year old female who presented to the ED with nausea, vomiting, and diarrhea.  She also felt very dehydrated.  In the ED, creatinine was elevated to 2.44.  She was admitted for further management.  GI pathogen panel was positive for norovirus.  She received IV fluids and IV antiemetics.  Her creatinine improved to 1.19 on the day of discharge.  Losartan and HCTZ were held and patient should restart these in 1 day.  Her nausea and vomiting resolved.  She was discharged home with close PCP follow-up.  DISCHARGE CONDITIONS:  Hypertension Ulcerative colitis Norovirus infection CONSULTS OBTAINED:  None DRUG ALLERGIES:  No Known Allergies DISCHARGE MEDICATIONS:   Allergies as of 10/05/2018   No Known Allergies     Medication List    TAKE these medications   hydrochlorothiazide 12.5 MG tablet Commonly known as:  HYDRODIURIL Take 12.5 mg by mouth daily.   JUNEL FE 1/20 1-20 MG-MCG tablet Generic drug:  norethindrone-ethinyl estradiol   losartan 100 MG tablet Commonly known as:  COZAAR Take 100 mg by mouth daily.   ondansetron 4 MG tablet Commonly known as:  ZOFRAN Take 1 tablet (4 mg total) by mouth every 6 (six) hours as needed for nausea.   Vitamin D3 125 MCG (5000 UT) Tabs Take 5,000 Units by mouth daily.        DISCHARGE INSTRUCTIONS:  1.  Follow-up with PCP in 5 days 2.  Encouraged p.o. intake DIET:  Cardiac  diet DISCHARGE CONDITION:  Stable ACTIVITY:  Activity as tolerated OXYGEN:  Home Oxygen: No.  Oxygen Delivery: room air DISCHARGE LOCATION:  home   If you experience worsening of your admission symptoms, develop shortness of breath, life threatening emergency, suicidal or homicidal thoughts you must seek medical attention immediately by calling 911 or calling your MD immediately  if symptoms less severe.  You Must read complete instructions/literature along with all the possible adverse reactions/side effects for all the Medicines you take and that have been prescribed to you. Take any new Medicines after you have completely understood and accpet all the possible adverse reactions/side effects.   Please note  You were cared for by a hospitalist during your hospital stay. If you have any questions about your discharge medications or the care you received while you were in the hospital after you are discharged, you can call the unit and asked to speak with the hospitalist on call if the hospitalist that took care of you is not available. Once you are discharged, your primary care physician will handle any further medical issues. Please note that NO REFILLS for any discharge medications will be authorized once you are discharged, as it is imperative that you return to your primary care physician (or establish a relationship with a primary care physician if you do not have one) for your aftercare needs so that they can reassess your need for medications and monitor your  lab values.    On the day of Discharge:  VITAL SIGNS:  Blood pressure 114/83, pulse 82, temperature 98 F (36.7 C), temperature source Oral, resp. rate 16, height 5\' 8"  (1.727 m), weight 77.1 kg, SpO2 100 %. PHYSICAL EXAMINATION:  GENERAL:  48 y.o.-year-old patient lying in the bed with no acute distress.  EYES: Pupils equal, round, reactive to light and accommodation. No scleral icterus. Extraocular muscles intact.  HEENT:  Head atraumatic, normocephalic. Oropharynx and nasopharynx clear.  NECK:  Supple, no jugular venous distention. No thyroid enlargement, no tenderness.  LUNGS: Normal breath sounds bilaterally, no wheezing, rales,rhonchi or crepitation. No use of accessory muscles of respiration.  CARDIOVASCULAR: RRR, S1, S2 normal. No murmurs, rubs, or gallops.  ABDOMEN: Soft, non-distended. Bowel sounds present. No organomegaly or mass. + Very mild diffuse tenderness to palpation, no rebound or guarding. EXTREMITIES: No pedal edema, cyanosis, or clubbing.  NEUROLOGIC: Cranial nerves II through XII are intact. Muscle strength 5/5 in all extremities. Sensation intact. Gait not checked.  PSYCHIATRIC: The patient is alert and oriented x 3.  SKIN: No obvious rash, lesion, or ulcer.  DATA REVIEW:   CBC Recent Labs  Lab 10/05/18 0429  WBC 7.8  HGB 14.2  HCT 44.4  PLT 300    Chemistries  Recent Labs  Lab 10/04/18 1449 10/05/18 0429  NA 132* 138  K 3.6 3.5  CL 98 107  CO2 18* 24  GLUCOSE 168* 100*  BUN 41* 32*  CREATININE 2.44* 1.19*  CALCIUM 9.6 8.2*  AST 27  --   ALT 27  --   ALKPHOS 60  --   BILITOT 0.4  --      Microbiology Results  Results for orders placed or performed during the hospital encounter of 10/04/18  C difficile quick scan w PCR reflex     Status: None   Collection Time: 10/04/18  4:57 PM  Result Value Ref Range Status   C Diff antigen NEGATIVE NEGATIVE Final   C Diff toxin NEGATIVE NEGATIVE Final   C Diff interpretation No C. difficile detected.  Final    Comment: Performed at [facility], [address] [place], [place] [address]  Gastrointestinal Panel by PCR , Stool     Status: Abnormal   Collection Time: 10/04/18  4:57 PM  Result Value Ref Range Status   Campylobacter species NOT DETECTED NOT DETECTED Final   Plesimonas shigelloides NOT DETECTED NOT DETECTED Final   Salmonella species NOT DETECTED NOT DETECTED Final   Yersinia enterocolitica NOT  DETECTED NOT DETECTED Final   Vibrio species NOT DETECTED NOT DETECTED Final   Vibrio cholerae NOT DETECTED NOT DETECTED Final   Enteroaggregative E coli (EAEC) NOT DETECTED NOT DETECTED Final   Enteropathogenic E coli (EPEC) NOT DETECTED NOT DETECTED Final   Enterotoxigenic E coli (ETEC) NOT DETECTED NOT DETECTED Final   Shiga like toxin producing E coli (STEC) NOT DETECTED NOT DETECTED Final   Shigella/Enteroinvasive E coli (EIEC) NOT DETECTED NOT DETECTED Final   Cryptosporidium NOT DETECTED NOT DETECTED Final   Cyclospora cayetanensis NOT DETECTED NOT DETECTED Final   Entamoeba histolytica NOT DETECTED NOT DETECTED Final   Giardia lamblia NOT DETECTED NOT DETECTED Final   Adenovirus F40/41 NOT DETECTED NOT DETECTED Final   Astrovirus NOT DETECTED NOT DETECTED Final   Norovirus GI/GII DETECTED (A) NOT DETECTED Final    Comment: RESULT CALLED TO, READ BACK BY AND VERIFIED WITH: Montel ClockKIERRA TORAIN @2355  10/04/18 AKT    Rotavirus A NOT  DETECTED NOT DETECTED Final   Sapovirus (I, II, IV, and V) NOT DETECTED NOT DETECTED Final    Comment: Performed at [facility], [address] [place], [place] [address]    RADIOLOGY:  No results found.   Management plans discussed with the patient, family and they are in agreement.  CODE STATUS: Full Code   TOTAL TIME TAKING CARE OF THIS PATIENT: 45 minutes.    Jinny Blossom Geovanie Winnett M.D on 10/05/2018 at 4:47 PM  Between 7am to 6pm - Pager - [phone]  After 6pm go to [email or web address] - Social research officer, government  [facility]  Office  [phone]  CC: Primary care physician; Myrene Buddy, NP   Note: This dictation was prepared with Dragon dictation along with smaller phrase technology. Any transcriptional errors that result from this process are unintentional.

## 2018-10-05 NOTE — ED Notes (Signed)
Pt states that she feels like she has a UTI, she states that this is a new sensation that began this am.  She reports pain when she voids.  Will notify EDP

## 2018-10-05 NOTE — ED Notes (Signed)
Pt ambulating to to restroom , no distress noted

## 2018-10-05 NOTE — Discharge Instructions (Signed)
It was so nice to meet you during this hospitalization!  You came into the hospital with nausea, vomiting, and diarrhea. You tested positive for norovirus. We gave you IV fluids to help rehydrate you. Please drink plenty of fluids when you get home.  I sent in a prescription for zofran to your pharmacy that you can use as needed for nausea and vomiting.  Take care, Dr. Nancy Marus

## 2018-10-05 NOTE — ED Notes (Signed)
Pt ambulates to the bathroom.  Call to lab to add urine culture to previously sent urine.

## 2018-10-05 NOTE — ED Notes (Signed)
Pt would like to stay in the hospital, she is concerned about getting her family sick and also notes that she does not want to go back to feeling the way she has been.  MD was notified

## 2018-10-06 LAB — URINE CULTURE

## 2018-10-06 LAB — HIV ANTIBODY (ROUTINE TESTING W REFLEX): HIV Screen 4th Generation wRfx: NONREACTIVE

## 2018-11-23 ENCOUNTER — Other Ambulatory Visit: Payer: BLUE CROSS/BLUE SHIELD

## 2019-05-21 ENCOUNTER — Other Ambulatory Visit: Payer: Self-pay | Admitting: Obstetrics & Gynecology

## 2019-05-21 DIAGNOSIS — Z87898 Personal history of other specified conditions: Secondary | ICD-10-CM

## 2019-05-21 DIAGNOSIS — N6310 Unspecified lump in the right breast, unspecified quadrant: Secondary | ICD-10-CM

## 2019-06-17 ENCOUNTER — Ambulatory Visit
Admission: RE | Admit: 2019-06-17 | Discharge: 2019-06-17 | Disposition: A | Discharge status: Home / Self Care | Payer: PRIVATE HEALTH INSURANCE | Source: Ambulatory Visit | Attending: Obstetrics & Gynecology | Admitting: Obstetrics & Gynecology

## 2019-06-17 DIAGNOSIS — N6310 Unspecified lump in the right breast, unspecified quadrant: Secondary | ICD-10-CM | POA: Diagnosis present

## 2019-06-17 DIAGNOSIS — Z87898 Personal history of other specified conditions: Secondary | ICD-10-CM

## 2019-08-06 HISTORY — PX: CHOLECYSTECTOMY: SHX55

## 2020-05-12 ENCOUNTER — Other Ambulatory Visit (HOSPITAL_COMMUNITY): Payer: Self-pay | Admitting: Podiatry

## 2020-05-12 ENCOUNTER — Other Ambulatory Visit: Payer: Self-pay | Admitting: Podiatry

## 2020-05-12 DIAGNOSIS — R600 Localized edema: Secondary | ICD-10-CM

## 2020-05-16 ENCOUNTER — Ambulatory Visit
Admission: RE | Admit: 2020-05-16 | Discharge: 2020-05-16 | Disposition: A | Discharge status: Home / Self Care | Payer: Self-pay | Source: Ambulatory Visit | Attending: Podiatry | Admitting: Podiatry

## 2020-05-16 ENCOUNTER — Other Ambulatory Visit: Payer: Self-pay

## 2020-05-16 DIAGNOSIS — R600 Localized edema: Secondary | ICD-10-CM | POA: Insufficient documentation

## 2020-06-13 ENCOUNTER — Ambulatory Visit
Admission: RE | Admit: 2020-06-13 | Discharge: 2020-06-13 | Disposition: A | Discharge status: Home / Self Care | Payer: 59 | Source: Ambulatory Visit | Attending: Nurse Practitioner | Admitting: Nurse Practitioner

## 2020-06-13 ENCOUNTER — Other Ambulatory Visit: Payer: Self-pay

## 2020-06-13 ENCOUNTER — Other Ambulatory Visit: Payer: Self-pay | Admitting: Nurse Practitioner

## 2020-06-13 DIAGNOSIS — R1011 Right upper quadrant pain: Secondary | ICD-10-CM | POA: Insufficient documentation

## 2020-06-16 ENCOUNTER — Ambulatory Visit: Payer: Self-pay | Admitting: General Surgery

## 2020-06-16 ENCOUNTER — Encounter: Payer: Self-pay | Admitting: General Surgery

## 2020-06-16 NOTE — Telephone Encounter (Signed)
This encounter was created in error - please disregard.

## 2020-06-16 NOTE — H&P (Signed)
PATIENT PROFILE: Amber Blair is a 49 y.o. female who presents to the Clinic for consultation at the request of Dr. Dayton Martes for evaluation of cholelithiasis.  PCP:  Sallee Lange, NP  HISTORY OF PRESENT ILLNESS: Ms. Flow reports she had an episode of epigastric discomfort and indigestion 1 week ago.  She reported that it came after she ate hot dog with chili.  She developed diarrhea and right upper quadrant pain.  Right upper current pain exacerbated by palpation.  There has been no alleviating factors.  The pain has been slowly improving but still present.  She went to see her primary care physician on Monday and ultrasound was ordered.  Abdominal ultrasound showed a distended gallbladder without gallbladder wall thickening and there was presence of gallstones.  There was no pericholecystic fluid.  There was negative Murphy sign.  Also labs were ordered without leukocytosis.  Liver enzymes and bilirubin within normal limits.  I personally evaluated the images of the ultrasound and I reviewed the labs.   PROBLEM LIST:         Problem List  Date Reviewed: 06/13/2020         Noted   Situational mixed anxiety and depressive disorder 03/14/2020   Chronic pain of left knee 11/15/2019   Chronic pain of right knee 11/15/2019   Ulcerative colitis (CMS-HCC) Unknown   Seasonal allergies Unknown   Impaired glucose tolerance 04/21/2018   Overview    HgbA1c 5.8% 04/21/2018      Vitamin D deficiency 04/21/2018   Overview    25-OH vitamin D level 25.5 on 04/21/2018      Mixed hyperlipidemia 04/12/2016   Benign essential HTN 04/12/2016      GENERAL REVIEW OF SYSTEMS:   General ROS: negative for - chills, fatigue, fever, weight gain or weight loss Allergy and Immunology ROS: negative for - hives  Hematological and Lymphatic ROS: negative for - bleeding problems or bruising, negative for palpable nodes Endocrine ROS: negative for - heat or cold intolerance, hair  changes Respiratory ROS: negative for - cough, shortness of breath or wheezing Cardiovascular ROS: no chest pain or palpitations GI ROS: negative for nausea, vomiting, constipation.  Positive for abdominal pain and diarrhea Musculoskeletal ROS: negative for - joint swelling or muscle pain Neurological ROS: negative for - confusion, syncope Dermatological ROS: negative for pruritus and rash Psychiatric: negative for anxiety, depression, difficulty sleeping and memory loss  MEDICATIONS: Current Medications        Current Outpatient Medications  Medication Sig Dispense Refill  . cholecalciferol, vitamin D3, (VITAMIN D3) 5,000 unit Tab Take 1 tablet by mouth once daily    . fexofenadine (ALLEGRA) 180 MG tablet Take 180 mg by mouth once daily    . hydroCHLOROthiazide (HYDRODIURIL) 12.5 MG tablet TAKE 1 TABLET BY MOUTH EVERY DAY 90 tablet 1  . JUNEL FE 1/20, 28, 1 mg-20 mcg (21)/75 mg (7) tablet TAKE 1 TABLET BY MOUTH ONCE DAILY SKIP PLACEBO PILLS. TAKE CONTINUOUSLY 84 tablet 3  . losartan (COZAAR) 100 MG tablet TAKE 1 TABLET BY MOUTH EVERY DAY 90 tablet 1  . valACYclovir (VALTREX) 1000 MG tablet TAKE 2 TABLETS BY MOUTH THEN TAKE 2 TABLETS 12 HOURS AFTER FIRST DOSE AT FIRST SIGN OF FEVER BLISTER     No current facility-administered medications for this visit.      ALLERGIES: Patient has no known allergies.  PAST MEDICAL HISTORY:     Past Medical History:  Diagnosis Date  . Allergy    I  have had allergies to grass, trees, etc since I was a chil  . Chronic tension-type headache, not intractable 05/09/2016  . History of blood transfusion Feb 2003   During surgery  . Hyperlipidemia   . Hypertension   . Impaired glucose tolerance 04/21/2018   HgbA1c 5.8% 04/21/2018  . Seasonal allergies   . Situational mixed anxiety and depressive disorder 03/14/2020  . Tinnitus of left ear 11/15/2019  . Ulcerative colitis (CMS-HCC)   . Vitamin D deficiency 04/21/2018   25-OH vitamin  D level 25.5 on 04/21/2018    PAST SURGICAL HISTORY:      Past Surgical History:  Procedure Laterality Date  . COLECTOMY PARTIAL W/ANASTAMOSIS  2002   at [facility]  . COLON SURGERY  Feb 2003   Had large intestine removed  . TONSILLECTOMY       FAMILY HISTORY:      Family History  Problem Relation Age of Onset  . Asthma Father   . High blood pressure (Hypertension) Father   . Glaucoma Father   . Obesity Father   . Diabetes type I Daughter   . Diabetes Daughter   . High blood pressure (Hypertension) Mother   . Osteoporosis (Thinning of bones) Mother   . Stroke Mother   . Heart failure Maternal Grandmother   . Heart failure Maternal Grandfather   . Breast cancer Paternal Grandmother   . Colon cancer Paternal Grandmother      SOCIAL HISTORY: Social History          Socioeconomic History  . Marital status: Married    Spouse name: Gerald Stabs  . Number of children: 2  . Years of education: Not on file  . Highest education level: Not on file  Occupational History  . Occupation: Accounting  Tobacco Use  . Smoking status: Never Smoker  . Smokeless tobacco: Never Used  Vaping Use  . Vaping Use: Never used  Substance and Sexual Activity  . Alcohol use: Not Currently    Alcohol/week: 0.0 standard drinks  . Drug use: Never  . Sexual activity: Yes    Partners: Male    Birth control/protection: Pill  Other Topics Concern  . Not on file  Social History Narrative  . Not on file   Social Determinants of Health   Financial Resource Strain: Not on file  Food Insecurity: Not on file  Transportation Needs: Not on file      PHYSICAL EXAM: Vitals:   06/16/20 1124  BP: 131/88  Pulse: 87   Body mass index is 25.7 kg/m. Weight: 76.7 kg (169 lb)   GENERAL: Alert, active, oriented x3  HEENT: Pupils equal reactive to light. Extraocular movements are intact. Sclera clear. Palpebral conjunctiva normal red color.Pharynx clear.  NECK:  Supple with no palpable mass and no adenopathy.  LUNGS: Sound clear with no rales rhonchi or wheezes.  HEART: Regular rhythm S1 and S2 without murmur.  ABDOMEN: Soft and depressible, nontender with no palpable mass, no hepatomegaly.   EXTREMITIES: Well-developed well-nourished symmetrical with no dependent edema.  NEUROLOGICAL: Awake alert oriented, facial expression symmetrical, moving all extremities.  REVIEW OF DATA: I have reviewed the following data today:      Office Visit on 06/13/2020  Component Date Value  . Color 06/13/2020 Yellow   . Clarity 06/13/2020 Clear   . Specific Gravity 06/13/2020 1.020   . pH, Urine 06/13/2020 6.0   . Protein, Urinalysis 06/13/2020 Negative   . Glucose, Urinalysis 06/13/2020 Negative   . Ketones, Urinalysis 06/13/2020 Negative   .  Blood, Urinalysis 06/13/2020 Moderate*  . Nitrite, Urinalysis 06/13/2020 Negative   . Leukocyte Esterase, Urin* 06/13/2020 Negative   . White Blood Cells, Urina* 06/13/2020 0-3   . Red Blood Cells, Urinaly* 06/13/2020 4-10*  . Bacteria, Urinalysis 06/13/2020 Few*  . Squamous Epithelial Cell* 06/13/2020 Rare   . WBC (White Blood Cell Co* 06/13/2020 6.4   . RBC (Red Blood Cell Coun* 06/13/2020 4.92   . Hemoglobin 06/13/2020 14.7   . Hematocrit 06/13/2020 44.0   . MCV (Mean Corpuscular Vo* 06/13/2020 89.4   . MCH (Mean Corpuscular He* 06/13/2020 29.9   . MCHC (Mean Corpuscular H* 06/13/2020 33.4   . Platelet Count 06/13/2020 250   . RDW-CV (Red Cell Distrib* 06/13/2020 12.8   . MPV (Mean Platelet Volum* 06/13/2020 9.1*  . Neutrophils 06/13/2020 3.90   . Lymphocytes 06/13/2020 1.80   . Mixed Count 06/13/2020 0.70   . Neutrophil % 06/13/2020 61.8   . Lymphocyte % 06/13/2020 27.7   . Mixed % 06/13/2020 10.5   . Glucose 06/13/2020 92   . Sodium 06/13/2020 134*  . Potassium 06/13/2020 3.8   . Chloride 06/13/2020 103   . Carbon Dioxide (CO2) 06/13/2020 28.9   . Urea Nitrogen (BUN) 06/13/2020 16   .  Creatinine 06/13/2020 0.8   . Glomerular Filtration Ra* 06/13/2020 76   . Calcium 06/13/2020 9.0   . AST  06/13/2020 22   . ALT  06/13/2020 21   . Alk Phos (alkaline Phosp* 06/13/2020 89   . Albumin 06/13/2020 4.1   . Bilirubin, Total 06/13/2020 0.3   . Protein, Total 06/13/2020 7.2   . A/G Ratio 06/13/2020 1.3   . Lipase 06/13/2020 34   Initial consult on 04/28/2020  Component Date Value  . Uric Acid 04/28/2020 6.5      ASSESSMENT: Ms. Carrigg is a 49 y.o. female presenting for consultation for cholelithiasis.    Patient was oriented about the diagnosis of cholelithiasis. Also oriented about what is the gallbladder, its anatomy and function and the implications of having stones. The patient was oriented about the treatment alternatives (observation vs cholecystectomy). Patient was oriented that a low percentage of patient will continue to have similar pain symptoms even after the gallbladder is removed. Surgical technique (open vs laparoscopic) was discussed. It was also discussed the goals of the surgery (decrease the pain episodes and avoid the risk of cholecystitis) and the risk of surgery including: bleeding, infection, common bile duct injury, stone retention, injury to other organs such as bowel, liver, stomach, other complications such as hernia, bowel obstruction among others. Also discussed with patient about anesthesia and its complications such as: reaction to medications, pneumonia, heart complications, death, among others.  Since this was the first episode I discussed with the patient the possibility of posterior patient pursue surgical management.  I discussed with her the risk of observation and I also discussed with her the risk of surgery.  Initially she was going to think about it and discuss it with her husband.  Later during the day the patient come back and she decided to proceed with surgery.  We will coordinate surgery for next week.  Cholelithiasis without  cholecystitis [K80.20]  PLAN: Robotic laparoscopic cholecystectomy ([id number])  Patient verbalized understanding, all questions were answered, and were agreeable with the plan outlined above.     Herbert Pun, MD

## 2020-06-16 NOTE — H&P (View-Only) (Signed)
PATIENT PROFILE: Amber Blair is a 49 y.o. female who presents to the Clinic for consultation at the request of Amber Blair for evaluation of cholelithiasis.  PCP:  Amber Lange, NP  HISTORY OF PRESENT ILLNESS: Amber Blair reports she had an episode of epigastric discomfort and indigestion 1 week ago.  She reported that it came after she ate hot dog with chili.  She developed diarrhea and right upper quadrant pain.  Right upper current pain exacerbated by palpation.  There has been no alleviating factors.  The pain has been slowly improving but still present.  She went to see her primary care physician on Monday and ultrasound was ordered.  Abdominal ultrasound showed a distended gallbladder without gallbladder wall thickening and there was presence of gallstones.  There was no pericholecystic fluid.  There was negative Murphy sign.  Also labs were ordered without leukocytosis.  Liver enzymes and bilirubin within normal limits.  I personally evaluated the images of the ultrasound and I reviewed the labs.   PROBLEM LIST:         Problem List  Date Reviewed: 06/13/2020         Noted   Situational mixed anxiety and depressive disorder 03/14/2020   Chronic pain of left knee 11/15/2019   Chronic pain of right knee 11/15/2019   Ulcerative colitis (CMS-HCC) Unknown   Seasonal allergies Unknown   Impaired glucose tolerance 04/21/2018   Overview    HgbA1c 5.8% 04/21/2018      Vitamin D deficiency 04/21/2018   Overview    25-OH vitamin D level 25.5 on 04/21/2018      Mixed hyperlipidemia 04/12/2016   Benign essential HTN 04/12/2016      GENERAL REVIEW OF SYSTEMS:   General ROS: negative for - chills, fatigue, fever, weight gain or weight loss Allergy and Immunology ROS: negative for - hives  Hematological and Lymphatic ROS: negative for - bleeding problems or bruising, negative for palpable nodes Endocrine ROS: negative for - heat or cold intolerance, hair  changes Respiratory ROS: negative for - cough, shortness of breath or wheezing Cardiovascular ROS: no chest pain or palpitations GI ROS: negative for nausea, vomiting, constipation.  Positive for abdominal pain and diarrhea Musculoskeletal ROS: negative for - joint swelling or muscle pain Neurological ROS: negative for - confusion, syncope Dermatological ROS: negative for pruritus and rash Psychiatric: negative for anxiety, depression, difficulty sleeping and memory loss  MEDICATIONS: Current Medications        Current Outpatient Medications  Medication Sig Dispense Refill  . cholecalciferol, vitamin D3, (VITAMIN D3) 5,000 unit Tab Take 1 tablet by mouth once daily    . fexofenadine (ALLEGRA) 180 MG tablet Take 180 mg by mouth once daily    . hydroCHLOROthiazide (HYDRODIURIL) 12.5 MG tablet TAKE 1 TABLET BY MOUTH EVERY DAY 90 tablet 1  . JUNEL FE 1/20, 28, 1 mg-20 mcg (21)/75 mg (7) tablet TAKE 1 TABLET BY MOUTH ONCE DAILY SKIP PLACEBO PILLS. TAKE CONTINUOUSLY 84 tablet 3  . losartan (COZAAR) 100 MG tablet TAKE 1 TABLET BY MOUTH EVERY DAY 90 tablet 1  . valACYclovir (VALTREX) 1000 MG tablet TAKE 2 TABLETS BY MOUTH THEN TAKE 2 TABLETS 12 HOURS AFTER FIRST DOSE AT FIRST SIGN OF FEVER BLISTER     No current facility-administered medications for this visit.      ALLERGIES: Patient has no known allergies.  PAST MEDICAL HISTORY:     Past Medical History:  Diagnosis Date  . Allergy    I  have had allergies to grass, trees, etc since I was a chil  . Chronic tension-type headache, not intractable 05/09/2016  . History of blood transfusion Feb 2003   During surgery  . Hyperlipidemia   . Hypertension   . Impaired glucose tolerance 04/21/2018   HgbA1c 5.8% 04/21/2018  . Seasonal allergies   . Situational mixed anxiety and depressive disorder 03/14/2020  . Tinnitus of left ear 11/15/2019  . Ulcerative colitis (CMS-HCC)   . Vitamin D deficiency 04/21/2018   25-OH vitamin  D level 25.5 on 04/21/2018    PAST SURGICAL HISTORY:      Past Surgical History:  Procedure Laterality Date  . COLECTOMY PARTIAL W/ANASTAMOSIS  2002   at [facility]  . COLON SURGERY  Feb 2003   Had large intestine removed  . TONSILLECTOMY       FAMILY HISTORY:      Family History  Problem Relation Age of Onset  . Asthma Father   . High blood pressure (Hypertension) Father   . Glaucoma Father   . Obesity Father   . Diabetes type I Daughter   . Diabetes Daughter   . High blood pressure (Hypertension) Mother   . Osteoporosis (Thinning of bones) Mother   . Stroke Mother   . Heart failure Maternal Grandmother   . Heart failure Maternal Grandfather   . Breast cancer Paternal Grandmother   . Colon cancer Paternal Grandmother      SOCIAL HISTORY: Social History          Socioeconomic History  . Marital status: Married    Spouse name: Amber Blair  . Number of children: 2  . Years of education: Not on file  . Highest education level: Not on file  Occupational History  . Occupation: Accounting  Tobacco Use  . Smoking status: Never Smoker  . Smokeless tobacco: Never Used  Vaping Use  . Vaping Use: Never used  Substance and Sexual Activity  . Alcohol use: Not Currently    Alcohol/week: 0.0 standard drinks  . Drug use: Never  . Sexual activity: Yes    Partners: Male    Birth control/protection: Pill  Other Topics Concern  . Not on file  Social History Narrative  . Not on file   Social Determinants of Health   Financial Resource Strain: Not on file  Food Insecurity: Not on file  Transportation Needs: Not on file      PHYSICAL EXAM: Vitals:   06/16/20 1124  BP: 131/88  Pulse: 87   Body mass index is 25.7 kg/m. Weight: 76.7 kg (169 lb)   GENERAL: Alert, active, oriented x3  HEENT: Pupils equal reactive to light. Extraocular movements are intact. Sclera clear. Palpebral conjunctiva normal red color.Pharynx clear.  NECK:  Supple with no palpable mass and no adenopathy.  LUNGS: Sound clear with no rales rhonchi or wheezes.  HEART: Regular rhythm S1 and S2 without murmur.  ABDOMEN: Soft and depressible, nontender with no palpable mass, no hepatomegaly.   EXTREMITIES: Well-developed well-nourished symmetrical with no dependent edema.  NEUROLOGICAL: Awake alert oriented, facial expression symmetrical, moving all extremities.  REVIEW OF DATA: I have reviewed the following data today:      Office Visit on 06/13/2020  Component Date Value  . Color 06/13/2020 Yellow   . Clarity 06/13/2020 Clear   . Specific Gravity 06/13/2020 1.020   . pH, Urine 06/13/2020 6.0   . Protein, Urinalysis 06/13/2020 Negative   . Glucose, Urinalysis 06/13/2020 Negative   . Ketones, Urinalysis 06/13/2020 Negative   .  Blood, Urinalysis 06/13/2020 Moderate*  . Nitrite, Urinalysis 06/13/2020 Negative   . Leukocyte Esterase, Urin* 06/13/2020 Negative   . White Blood Cells, Urina* 06/13/2020 0-3   . Red Blood Cells, Urinaly* 06/13/2020 4-10*  . Bacteria, Urinalysis 06/13/2020 Few*  . Squamous Epithelial Cell* 06/13/2020 Rare   . WBC (White Blood Cell Co* 06/13/2020 6.4   . RBC (Red Blood Cell Coun* 06/13/2020 4.92   . Hemoglobin 06/13/2020 14.7   . Hematocrit 06/13/2020 44.0   . MCV (Mean Corpuscular Vo* 06/13/2020 89.4   . MCH (Mean Corpuscular He* 06/13/2020 29.9   . MCHC (Mean Corpuscular H* 06/13/2020 33.4   . Platelet Count 06/13/2020 250   . RDW-CV (Red Cell Distrib* 06/13/2020 12.8   . MPV (Mean Platelet Volum* 06/13/2020 9.1*  . Neutrophils 06/13/2020 3.90   . Lymphocytes 06/13/2020 1.80   . Mixed Count 06/13/2020 0.70   . Neutrophil % 06/13/2020 61.8   . Lymphocyte % 06/13/2020 27.7   . Mixed % 06/13/2020 10.5   . Glucose 06/13/2020 92   . Sodium 06/13/2020 134*  . Potassium 06/13/2020 3.8   . Chloride 06/13/2020 103   . Carbon Dioxide (CO2) 06/13/2020 28.9   . Urea Nitrogen (BUN) 06/13/2020 16   .  Creatinine 06/13/2020 0.8   . Glomerular Filtration Ra* 06/13/2020 76   . Calcium 06/13/2020 9.0   . AST  06/13/2020 22   . ALT  06/13/2020 21   . Alk Phos (alkaline Phosp* 06/13/2020 89   . Albumin 06/13/2020 4.1   . Bilirubin, Total 06/13/2020 0.3   . Protein, Total 06/13/2020 7.2   . A/G Ratio 06/13/2020 1.3   . Lipase 06/13/2020 34   Initial consult on 04/28/2020  Component Date Value  . Uric Acid 04/28/2020 6.5      ASSESSMENT: Ms. Barnhardt is a 49 y.o. female presenting for consultation for cholelithiasis.    Patient was oriented about the diagnosis of cholelithiasis. Also oriented about what is the gallbladder, its anatomy and function and the implications of having stones. The patient was oriented about the treatment alternatives (observation vs cholecystectomy). Patient was oriented that a low percentage of patient will continue to have similar pain symptoms even after the gallbladder is removed. Surgical technique (open vs laparoscopic) was discussed. It was also discussed the goals of the surgery (decrease the pain episodes and avoid the risk of cholecystitis) and the risk of surgery including: bleeding, infection, common bile duct injury, stone retention, injury to other organs such as bowel, liver, stomach, other complications such as hernia, bowel obstruction among others. Also discussed with patient about anesthesia and its complications such as: reaction to medications, pneumonia, heart complications, death, among others.  Since this was the first episode I discussed with the patient the possibility of posterior patient pursue surgical management.  I discussed with her the risk of observation and I also discussed with her the risk of surgery.  Initially she was going to think about it and discuss it with her husband.  Later during the day the patient come back and she decided to proceed with surgery.  We will coordinate surgery for next week.  Cholelithiasis without  cholecystitis [K80.20]  PLAN: Robotic laparoscopic cholecystectomy ([id number])  Patient verbalized understanding, all questions were answered, and were agreeable with the plan outlined above.     Herbert Pun, MD

## 2020-06-18 MED ORDER — INDOCYANINE GREEN 25 MG IV SOLR
1.2500 mg | Freq: Once | INTRAVENOUS | Status: AC
  Administered 2020-06-19: 1.25 mg via INTRAVENOUS
  Filled 2020-06-18: qty 0.5

## 2020-06-19 ENCOUNTER — Ambulatory Visit: Payer: 59 | Admitting: Certified Registered"

## 2020-06-19 ENCOUNTER — Ambulatory Visit
Admission: RE | Admit: 2020-06-19 | Discharge: 2020-06-19 | Disposition: A | Discharge status: Home / Self Care | Payer: 59 | Attending: General Surgery | Admitting: General Surgery

## 2020-06-19 ENCOUNTER — Other Ambulatory Visit
Admission: RE | Admit: 2020-06-19 | Discharge: 2020-06-19 | Disposition: A | Discharge status: Home / Self Care | Payer: 59 | Source: Ambulatory Visit | Attending: General Surgery | Admitting: General Surgery

## 2020-06-19 ENCOUNTER — Encounter: Payer: Self-pay | Admitting: General Surgery

## 2020-06-19 ENCOUNTER — Other Ambulatory Visit: Payer: Self-pay

## 2020-06-19 ENCOUNTER — Encounter
Admission: RE | Disposition: A | Discharge status: Home / Self Care | Payer: Self-pay | Source: Home / Self Care | Attending: General Surgery

## 2020-06-19 DIAGNOSIS — Z79899 Other long term (current) drug therapy: Secondary | ICD-10-CM | POA: Diagnosis not present

## 2020-06-19 DIAGNOSIS — K8020 Calculus of gallbladder without cholecystitis without obstruction: Secondary | ICD-10-CM | POA: Diagnosis present

## 2020-06-19 DIAGNOSIS — Z20822 Contact with and (suspected) exposure to covid-19: Secondary | ICD-10-CM | POA: Insufficient documentation

## 2020-06-19 DIAGNOSIS — K8010 Calculus of gallbladder with chronic cholecystitis without obstruction: Secondary | ICD-10-CM | POA: Insufficient documentation

## 2020-06-19 LAB — CBC
HCT: 41.3 % (ref 36.0–46.0)
Hemoglobin: 13.9 g/dL (ref 12.0–15.0)
MCH: 29.8 pg (ref 26.0–34.0)
MCHC: 33.7 g/dL (ref 30.0–36.0)
MCV: 88.6 fL (ref 80.0–100.0)
Platelets: 354 10*3/uL (ref 150–400)
RBC: 4.66 MIL/uL (ref 3.87–5.11)
RDW: 13 % (ref 11.5–15.5)
WBC: 7.6 10*3/uL (ref 4.0–10.5)
nRBC: 0 % (ref 0.0–0.2)

## 2020-06-19 LAB — BASIC METABOLIC PANEL
Anion gap: 12 (ref 5–15)
BUN: 17 mg/dL (ref 6–20)
CO2: 25 mmol/L (ref 22–32)
Calcium: 9 mg/dL (ref 8.9–10.3)
Chloride: 101 mmol/L (ref 98–111)
Creatinine, Ser: 0.83 mg/dL (ref 0.44–1.00)
GFR, Estimated: >60 mL/min (ref >60)
Glucose, Bld: 90 mg/dL (ref 70–99)
Potassium: 3.1 mmol/L — ABNORMAL LOW (ref 3.5–5.1)
Sodium: 138 mmol/L (ref 135–145)

## 2020-06-19 LAB — POCT PREGNANCY, URINE: Preg Test, Ur: NEGATIVE

## 2020-06-19 LAB — SARS CORONAVIRUS 2 BY RT PCR (HOSPITAL ORDER, PERFORMED IN ~~LOC~~ HOSPITAL LAB): SARS Coronavirus 2: NEGATIVE

## 2020-06-19 SURGERY — CHOLECYSTECTOMY, ROBOT-ASSISTED, LAPAROSCOPIC
Anesthesia: General

## 2020-06-19 MED ORDER — PHENYLEPHRINE HCL (PRESSORS) 10 MG/ML IV SOLN
INTRAVENOUS | Status: DC | PRN
  Administered 2020-06-19: 50 ug via INTRAVENOUS
  Administered 2020-06-19: 100 ug via INTRAVENOUS

## 2020-06-19 MED ORDER — ACETAMINOPHEN 10 MG/ML IV SOLN
INTRAVENOUS | Status: DC | PRN
  Administered 2020-06-19: 1000 mg via INTRAVENOUS

## 2020-06-19 MED ORDER — FAMOTIDINE 20 MG PO TABS
ORAL_TABLET | ORAL | Status: AC
  Administered 2020-06-19: 20 mg via ORAL
  Filled 2020-06-19: qty 1

## 2020-06-19 MED ORDER — PROPOFOL 10 MG/ML IV BOLUS
INTRAVENOUS | Status: DC | PRN
  Administered 2020-06-19: 160 mg via INTRAVENOUS

## 2020-06-19 MED ORDER — FENTANYL CITRATE (PF) 100 MCG/2ML IJ SOLN
25.0000 ug | INTRAMUSCULAR | Status: DC | PRN
  Administered 2020-06-19 (×4): 25 ug via INTRAVENOUS

## 2020-06-19 MED ORDER — ACETAMINOPHEN 10 MG/ML IV SOLN
INTRAVENOUS | Status: AC
  Filled 2020-06-19: qty 100

## 2020-06-19 MED ORDER — HYDROCODONE-ACETAMINOPHEN 5-325 MG PO TABS
1.0000 | ORAL_TABLET | Freq: Once | ORAL | Status: AC
  Administered 2020-06-19: 1 via ORAL

## 2020-06-19 MED ORDER — FENTANYL CITRATE (PF) 100 MCG/2ML IJ SOLN
INTRAMUSCULAR | Status: AC
  Filled 2020-06-19: qty 2

## 2020-06-19 MED ORDER — BUPIVACAINE-EPINEPHRINE 0.25% -1:200000 IJ SOLN
INTRAMUSCULAR | Status: DC | PRN
  Administered 2020-06-19: 30 mL

## 2020-06-19 MED ORDER — CEFAZOLIN SODIUM-DEXTROSE 2-4 GM/100ML-% IV SOLN
INTRAVENOUS | Status: AC
  Filled 2020-06-19: qty 100

## 2020-06-19 MED ORDER — DEXAMETHASONE SODIUM PHOSPHATE 10 MG/ML IJ SOLN
INTRAMUSCULAR | Status: DC | PRN
  Administered 2020-06-19: 8 mg via INTRAVENOUS

## 2020-06-19 MED ORDER — ONDANSETRON HCL 4 MG/2ML IJ SOLN
INTRAMUSCULAR | Status: DC | PRN
  Administered 2020-06-19: 4 mg via INTRAVENOUS

## 2020-06-19 MED ORDER — ROCURONIUM BROMIDE 100 MG/10ML IV SOLN
INTRAVENOUS | Status: DC | PRN
  Administered 2020-06-19: 50 mg via INTRAVENOUS

## 2020-06-19 MED ORDER — ONDANSETRON HCL 4 MG/2ML IJ SOLN: 4.0000 mg | Freq: Once | INTRAMUSCULAR | Status: DC | PRN

## 2020-06-19 MED ORDER — SODIUM CHLORIDE 0.9 % IV SOLN: INTRAVENOUS | Status: DC

## 2020-06-19 MED ORDER — SUGAMMADEX SODIUM 200 MG/2ML IV SOLN
INTRAVENOUS | Status: DC | PRN
  Administered 2020-06-19: 200 mg via INTRAVENOUS

## 2020-06-19 MED ORDER — MIDAZOLAM HCL 2 MG/2ML IJ SOLN
INTRAMUSCULAR | Status: AC
  Filled 2020-06-19: qty 2

## 2020-06-19 MED ORDER — ONDANSETRON HCL 4 MG/2ML IJ SOLN
INTRAMUSCULAR | Status: AC
  Filled 2020-06-19: qty 2

## 2020-06-19 MED ORDER — ROCURONIUM BROMIDE 10 MG/ML (PF) SYRINGE
PREFILLED_SYRINGE | INTRAVENOUS | Status: AC
  Filled 2020-06-19: qty 10

## 2020-06-19 MED ORDER — DEXMEDETOMIDINE (PRECEDEX) IN NS 20 MCG/5ML (4 MCG/ML) IV SYRINGE
PREFILLED_SYRINGE | INTRAVENOUS | Status: DC | PRN
  Administered 2020-06-19: 12 ug via INTRAVENOUS

## 2020-06-19 MED ORDER — CEFAZOLIN SODIUM-DEXTROSE 2-4 GM/100ML-% IV SOLN
2.0000 g | INTRAVENOUS | Status: AC
  Administered 2020-06-19: 2 g via INTRAVENOUS

## 2020-06-19 MED ORDER — PROPOFOL 10 MG/ML IV BOLUS
INTRAVENOUS | Status: AC
  Filled 2020-06-19: qty 20

## 2020-06-19 MED ORDER — HYDROCODONE-ACETAMINOPHEN 5-325 MG PO TABS: 1.0000 | ORAL_TABLET | ORAL | 0 refills | Status: AC | PRN

## 2020-06-19 MED ORDER — FAMOTIDINE 20 MG PO TABS: 20.0000 mg | ORAL_TABLET | Freq: Once | ORAL | Status: AC

## 2020-06-19 MED ORDER — DEXAMETHASONE SODIUM PHOSPHATE 10 MG/ML IJ SOLN
INTRAMUSCULAR | Status: AC
  Filled 2020-06-19: qty 1

## 2020-06-19 MED ORDER — MIDAZOLAM HCL 2 MG/2ML IJ SOLN
INTRAMUSCULAR | Status: DC | PRN
  Administered 2020-06-19: 2 mg via INTRAVENOUS

## 2020-06-19 MED ORDER — DEXMEDETOMIDINE (PRECEDEX) IN NS 20 MCG/5ML (4 MCG/ML) IV SYRINGE
PREFILLED_SYRINGE | INTRAVENOUS | Status: AC
  Filled 2020-06-19: qty 5

## 2020-06-19 MED ORDER — CHLORHEXIDINE GLUCONATE 0.12 % MT SOLN: 15.0000 mL | Freq: Once | OROMUCOSAL | Status: AC

## 2020-06-19 MED ORDER — LIDOCAINE HCL (CARDIAC) PF 100 MG/5ML IV SOSY
PREFILLED_SYRINGE | INTRAVENOUS | Status: DC | PRN
  Administered 2020-06-19: 60 mg via INTRAVENOUS

## 2020-06-19 MED ORDER — HYDROCODONE-ACETAMINOPHEN 5-325 MG PO TABS
ORAL_TABLET | ORAL | Status: AC
  Filled 2020-06-19: qty 1

## 2020-06-19 MED ORDER — FENTANYL CITRATE (PF) 100 MCG/2ML IJ SOLN
INTRAMUSCULAR | Status: DC | PRN
  Administered 2020-06-19: 100 ug via INTRAVENOUS
  Administered 2020-06-19 (×2): 25 ug via INTRAVENOUS
  Administered 2020-06-19: 50 ug via INTRAVENOUS

## 2020-06-19 MED ORDER — CHLORHEXIDINE GLUCONATE 0.12 % MT SOLN
OROMUCOSAL | Status: AC
  Administered 2020-06-19: 15 mL via OROMUCOSAL
  Filled 2020-06-19: qty 15

## 2020-06-19 MED ORDER — ORAL CARE MOUTH RINSE: 15.0000 mL | Freq: Once | OROMUCOSAL | Status: AC

## 2020-06-19 MED ORDER — BUPIVACAINE-EPINEPHRINE (PF) 0.25% -1:200000 IJ SOLN
INTRAMUSCULAR | Status: AC
  Filled 2020-06-19: qty 30

## 2020-06-19 MED ORDER — LIDOCAINE HCL (PF) 2 % IJ SOLN
INTRAMUSCULAR | Status: AC
  Filled 2020-06-19: qty 5

## 2020-06-19 SURGICAL SUPPLY — 57 items
ADH SKN CLS APL DERMABOND .7 (GAUZE/BANDAGES/DRESSINGS) ×1
APL PRP STRL LF DISP 70% ISPRP (MISCELLANEOUS) ×1
BAG INFUSER PRESSURE 100CC (MISCELLANEOUS) IMPLANT
BAG SPEC RTRVL LRG 6X4 10 (ENDOMECHANICALS) ×1
BLADE SURG SZ11 CARB STEEL (BLADE) ×3 IMPLANT
CANISTER SUCT 1200ML W/VALVE (MISCELLANEOUS) IMPLANT
CANNULA REDUC XI 12-8 STAPL (CANNULA) ×1
CANNULA REDUC XI 12-8MM STAPL (CANNULA) ×1
CANNULA REDUCER 12-8 DVNC XI (CANNULA) ×1 IMPLANT
CHLORAPREP W/TINT 26 (MISCELLANEOUS) ×3 IMPLANT
CLIP VESOLOCK MED LG 6/CT (CLIP) IMPLANT
COVER WAND RF STERILE (DRAPES) ×3 IMPLANT
DECANTER SPIKE VIAL GLASS SM (MISCELLANEOUS) ×3 IMPLANT
DEFOGGER SCOPE WARMER CLEARIFY (MISCELLANEOUS) ×3 IMPLANT
DERMABOND ADVANCED (GAUZE/BANDAGES/DRESSINGS) ×2
DERMABOND ADVANCED .7 DNX12 (GAUZE/BANDAGES/DRESSINGS) ×1 IMPLANT
DRAPE ARM DVNC X/XI (DISPOSABLE) ×4 IMPLANT
DRAPE COLUMN DVNC XI (DISPOSABLE) ×1 IMPLANT
DRAPE DA VINCI XI ARM (DISPOSABLE) ×8
DRAPE DA VINCI XI COLUMN (DISPOSABLE) ×2
ELECT REM PT RETURN 9FT ADLT (ELECTROSURGICAL) ×3
ELECTRODE REM PT RTRN 9FT ADLT (ELECTROSURGICAL) ×1 IMPLANT
GLOVE BIO SURGEON STRL SZ 6.5 (GLOVE) ×4 IMPLANT
GLOVE BIO SURGEONS STRL SZ 6.5 (GLOVE) ×2
GLOVE BIOGEL PI IND STRL 6.5 (GLOVE) ×2 IMPLANT
GLOVE BIOGEL PI INDICATOR 6.5 (GLOVE) ×4
GOWN STRL REUS W/ TWL LRG LVL3 (GOWN DISPOSABLE) ×3 IMPLANT
GOWN STRL REUS W/TWL LRG LVL3 (GOWN DISPOSABLE) ×9
GRASPER SUT TROCAR 14GX15 (MISCELLANEOUS) IMPLANT
IRRIGATOR SUCT 8 DISP DVNC XI (IRRIGATION / IRRIGATOR) IMPLANT
IRRIGATOR SUCTION 8MM XI DISP (IRRIGATION / IRRIGATOR)
IV NS 1000ML (IV SOLUTION)
IV NS 1000ML BAXH (IV SOLUTION) IMPLANT
KIT PINK PAD W/HEAD ARE REST (MISCELLANEOUS) ×3
KIT PINK PAD W/HEAD ARM REST (MISCELLANEOUS) ×1 IMPLANT
LABEL OR SOLS (LABEL) ×3 IMPLANT
MANIFOLD NEPTUNE II (INSTRUMENTS) ×3 IMPLANT
NEEDLE HYPO 22GX1.5 SAFETY (NEEDLE) ×3 IMPLANT
NEEDLE INSUFFLATION 14GA 120MM (NEEDLE) ×3 IMPLANT
NS IRRIG 500ML POUR BTL (IV SOLUTION) ×3 IMPLANT
OBTURATOR OPTICAL STANDARD 8MM (TROCAR) ×2
OBTURATOR OPTICAL STND 8 DVNC (TROCAR) ×1
OBTURATOR OPTICALSTD 8 DVNC (TROCAR) ×1 IMPLANT
PACK LAP CHOLECYSTECTOMY (MISCELLANEOUS) ×3 IMPLANT
POUCH SPECIMEN RETRIEVAL 10MM (ENDOMECHANICALS) ×3 IMPLANT
SEAL CANN UNIV 5-8 DVNC XI (MISCELLANEOUS) ×3 IMPLANT
SEAL XI 5MM-8MM UNIVERSAL (MISCELLANEOUS) ×6
SET TUBE SMOKE EVAC HIGH FLOW (TUBING) ×3 IMPLANT
SOLUTION ELECTROLUBE (MISCELLANEOUS) ×3 IMPLANT
STAPLER CANNULA SEAL DVNC XI (STAPLE) ×1 IMPLANT
STAPLER CANNULA SEAL XI (STAPLE) ×2
SUT MNCRL 4-0 (SUTURE) ×3
SUT MNCRL 4-0 27XMFL (SUTURE) ×1
SUT VIC AB 3-0 SH 27 (SUTURE)
SUT VIC AB 3-0 SH 27X BRD (SUTURE) IMPLANT
SUT VICRYL 0 AB UR-6 (SUTURE) IMPLANT
SUTURE MNCRL 4-0 27XMF (SUTURE) ×1 IMPLANT

## 2020-06-19 NOTE — Op Note (Signed)
Preoperative diagnosis: Symptomatic cholelithiasis  Postoperative diagnosis: Symptomatic cholelithiasis  Procedure: Robotic Assisted Laparoscopic Cholecystectomy.   Anesthesia: GETA   Surgeon: Dr. Windell Moment  Wound Classification: Clean Contaminated  Indications: Patient is a 49 y.o. female developed right upper quadrant pain and nausea and on workup was found to have cholelithiasis with a normal common duct. Robotic Assisted Laparoscopic cholecystectomy was elected.  Findings: Critical view of safety achieved Cystic duct and artery identified, ligated and divided Adequate hemostasis    Description of procedure: The patient was placed on the operating table in the supine position. General anesthesia was induced. A time-out was completed verifying correct patient, procedure, site, positioning, and implant(s) and/or special equipment prior to beginning this procedure. An orogastric tube was placed. The abdomen was prepped and draped in the usual sterile fashion.  An incision was made in a natural skin line below the umbilicus.  The fascia was elevated and the Veress needle inserted. Proper position was confirmed by aspiration and saline meniscus test.  The abdomen was insufflated with carbon dioxide to a pressure of 15 mmHg. The patient tolerated insufflation well. A 8-mm trocar was then inserted in optiview fashion.  The laparoscope was inserted and the abdomen inspected. No injuries from initial trocar placement were noted. Additional trocars were then inserted in the following locations: an 8-mm trocar in the left lateral abdomen, and another two 8-mm trocars to the right side of the abdomen 5 cm appart. The umbilical trocar was changed to a 12 mm trocar all under direct visualization. The abdomen was inspected and no abnormalities were found. The table was placed in the reverse Trendelenburg position with the right side up. The robotic arms were docked and target anatomy identified.  Instrument inserted under direct visualization.  Filmy adhesions between the gallbladder and omentum, duodenum and transverse colon were lysed with electrocautery. The dome of the gallbladder was grasped with a prograsp and retracted over the dome of the liver. The infundibulum was also grasped with an atraumatic grasper and retracted toward the right lower quadrant. This maneuver exposed Calots triangle. The peritoneum overlying the gallbladder infundibulum was then incised and the cystic duct and cystic artery identified and circumferentially dissected. Critical view of safety reviewed before ligating any structure. Firefly images taken to visualize biliary ducts. The cystic duct and cystic artery were then doubly clipped and divided close to the gallbladder.  The gallbladder was then dissected from its peritoneal attachments by electrocautery. Hemostasis was checked and the gallbladder and contained stones were removed using an endoscopic retrieval bag. The gallbladder was passed off the table as a specimen. The gallbladder fossa was copiously irrigated with saline and hemostasis was obtained. There was no evidence of bleeding from the gallbladder fossa or cystic artery or leakage of the bile from the cystic duct stump. Secondary trocars were removed under direct vision. No bleeding was noted. The robotic arms were undoked. The scope was withdrawn and the umbilical trocar removed. The abdomen was allowed to collapse. The fascia of the 90m trocar sites was closed with figure-of-eight 0 vicryl sutures. The skin was closed with subcuticular sutures of 4-0 monocryl and topical skin adhesive. The orogastric tube was removed.  The patient tolerated the procedure well and was taken to the postanesthesia care unit in stable condition.   Specimen: Gallbladder  Complications: None  EBL: 5 mL

## 2020-06-19 NOTE — Anesthesia Preprocedure Evaluation (Signed)
Anesthesia Evaluation  Patient identified by MRN, date of birth, ID band Patient awake    Reviewed: Allergy & Precautions, H&P , NPO status , Patient's Chart, lab work & pertinent test results, reviewed documented beta blocker date and time   Airway Mallampati: III  TM Distance: >3 FB Neck ROM: full    Dental  (+) Teeth Intact, Missing   Pulmonary neg pulmonary ROS,    Pulmonary exam normal        Cardiovascular Exercise Tolerance: Good hypertension, On Medications negative cardio ROS Normal cardiovascular exam Rhythm:regular Rate:Normal     Neuro/Psych negative neurological ROS  negative psych ROS   GI/Hepatic Neg liver ROS, PUD,   Endo/Other  negative endocrine ROS  Renal/GU Renal disease  negative genitourinary   Musculoskeletal   Abdominal   Peds  Hematology negative hematology ROS (+)   Anesthesia Other Findings Past Medical History: No date: Hypertension No date: Ulcerative colitis (HCC) Past Surgical History: No date: large intestine removed BMI    Body Mass Index: 25.09 kg/m     Reproductive/Obstetrics negative OB ROS                             Anesthesia Physical Anesthesia Plan  ASA: II  Anesthesia Plan: General ETT   Post-op Pain Management:    Induction:   PONV Risk Score and Plan:   Airway Management Planned:   Additional Equipment:   Intra-op Plan:   Post-operative Plan:   Informed Consent: I have reviewed the patients History and Physical, chart, labs and discussed the procedure including the risks, benefits and alternatives for the proposed anesthesia with the patient or authorized representative who has indicated his/her understanding and acceptance.     Dental Advisory Given  Plan Discussed with: CRNA  Anesthesia Plan Comments:         Anesthesia Quick Evaluation

## 2020-06-19 NOTE — Transfer of Care (Signed)
Immediate Anesthesia Transfer of Care Note  Patient: Amber Blair  Procedure(s) Performed: XI ROBOTIC ASSISTED LAPAROSCOPIC CHOLECYSTECTOMY (N/A )  Patient Location: PACU  Anesthesia Type:General  Level of Consciousness: drowsy  Airway & Oxygen Therapy: Patient Spontanous Breathing and Patient connected to face mask oxygen  Post-op Assessment: Report given to RN  Post vital signs: stable  Last Vitals:  Vitals Value Taken Time  BP 121/65 06/19/20 1558  Temp    Pulse 66 06/19/20 1558  Resp 13 06/19/20 1558  SpO2 100 % 06/19/20 1558  Vitals shown include unvalidated device data.  Last Pain:  Vitals:   06/19/20 1315  TempSrc: Oral  PainSc: 0-No pain         Complications: No complications documented.

## 2020-06-19 NOTE — Interval H&P Note (Signed)
History and Physical Interval Note:  56/43/3295 1:88 PM  Amber Blair  has presented today for surgery, with the diagnosis of cholelithasis.  The various methods of treatment have been discussed with the patient and family. After consideration of risks, benefits and other options for treatment, the patient has consented to  Procedure(s): XI ROBOTIC [place] (N/A) as a surgical intervention.  The patient's history has been reviewed, patient examined, no change in status, stable for surgery.  I have reviewed the patient's chart and labs.  Questions were answered to the patient's satisfaction.     Herbert Pun

## 2020-06-19 NOTE — Anesthesia Postprocedure Evaluation (Signed)
Anesthesia Post Note  Patient: Amber Blair  Procedure(s) Performed: XI ROBOTIC ASSISTED LAPAROSCOPIC CHOLECYSTECTOMY (N/A )  Patient location during evaluation: PACU Anesthesia Type: General Level of consciousness: awake and alert Pain management: pain level controlled Vital Signs Assessment: post-procedure vital signs reviewed and stable Respiratory status: spontaneous breathing and respiratory function stable Cardiovascular status: stable Anesthetic complications: no   No complications documented.   Last Vitals:  Vitals:   06/19/20 1628 06/19/20 1643  BP: 122/67 112/67  Pulse: 77 70  Resp: 17 12  Temp:    SpO2: 100% 100%    Last Pain:  Vitals:   06/19/20 1643  TempSrc:   PainSc: 4                  Cyril Woodmansee K

## 2020-06-19 NOTE — Discharge Instructions (Addendum)
  Diet: Resume home heart healthy regular diet.   Activity: No heavy lifting >20 pounds (children, pets, laundry, garbage) or strenuous activity until follow-up, but light activity and walking are encouraged. Do not drive or drink alcohol if taking narcotic pain medications.  Wound care: May shower with soapy water and pat dry (do not rub incisions), but no baths or submerging incision underwater until follow-up. (no swimming)   Medications: Resume all home medications. For mild to moderate pain: acetaminophen (Tylenol) or ibuprofen (if no kidney disease). Combining Tylenol with alcohol can substantially increase your risk of causing liver disease. Narcotic pain medications, if prescribed, can be used for severe pain, though may cause nausea, constipation, and drowsiness. Do not combine Tylenol and Norco within a 6 hour period as Norco contains Tylenol. If you do not need the narcotic pain medication, you do not need to fill the prescription.  Call office [phone]) at any time if any questions, worsening pain, fevers/chills, bleeding, drainage from incision site, or other concerns.   AMBULATORY SURGERY  DISCHARGE INSTRUCTIONS   1) The drugs that you were given will stay in your system until tomorrow so for the next 24 hours you should not:  A) Drive an automobile B) Make any legal decisions C) Drink any alcoholic beverage   2) You may resume regular meals tomorrow.  Today it is better to start with liquids and gradually work up to solid foods.  You may eat anything you prefer, but it is better to start with liquids, then soup and crackers, and gradually work up to solid foods.   3) Please notify your doctor immediately if you have any unusual bleeding, trouble breathing, redness and pain at the surgery site, drainage, fever, or pain not relieved by medication.    4) Additional Instructions:        Please contact your physician with any problems or Same Day Surgery at  [phone], Monday through Friday 6 am to 4 pm, or [place] at [facility] number at [phone].

## 2020-06-19 NOTE — Anesthesia Procedure Notes (Signed)
Procedure Name: Intubation Date/Time: 06/19/2020 2:46 PM Performed by: Jaye Beagle, CRNA Pre-anesthesia Checklist: Patient identified, Emergency Drugs available, Suction available and Patient being monitored Patient Re-evaluated:Patient Re-evaluated prior to induction Oxygen Delivery Method: Circle system utilized Preoxygenation: Pre-oxygenation with 100% oxygen Induction Type: IV induction Ventilation: Mask ventilation without difficulty Laryngoscope Size: McGraph and 3 Grade View: Grade I Tube type: Oral Tube size: 6.0 mm Number of attempts: 1 Airway Equipment and Method: Stylet and Oral airway Placement Confirmation: ETT inserted through vocal cords under direct vision,  positive ETCO2 and breath sounds checked- equal and bilateral Secured at: 22 cm Tube secured with: Tape Dental Injury: Teeth and Oropharynx as per pre-operative assessment

## 2020-06-21 LAB — SURGICAL PATHOLOGY

## 2020-09-18 ENCOUNTER — Other Ambulatory Visit: Payer: Self-pay | Admitting: Obstetrics & Gynecology

## 2020-09-19 ENCOUNTER — Other Ambulatory Visit: Payer: Self-pay | Admitting: Obstetrics & Gynecology

## 2020-09-19 DIAGNOSIS — N6001 Solitary cyst of right breast: Secondary | ICD-10-CM

## 2020-09-27 ENCOUNTER — Other Ambulatory Visit: Payer: Self-pay

## 2020-09-27 ENCOUNTER — Ambulatory Visit
Admission: RE | Admit: 2020-09-27 | Discharge: 2020-09-27 | Disposition: A | Discharge status: Home / Self Care | Payer: No Typology Code available for payment source | Source: Ambulatory Visit | Attending: Obstetrics & Gynecology | Admitting: Obstetrics & Gynecology

## 2020-09-27 DIAGNOSIS — N6001 Solitary cyst of right breast: Secondary | ICD-10-CM | POA: Insufficient documentation

## 2021-08-30 ENCOUNTER — Other Ambulatory Visit: Payer: Self-pay | Admitting: Obstetrics and Gynecology

## 2021-08-30 DIAGNOSIS — N6310 Unspecified lump in the right breast, unspecified quadrant: Secondary | ICD-10-CM

## 2021-08-30 DIAGNOSIS — N6311 Unspecified lump in the right breast, upper outer quadrant: Secondary | ICD-10-CM

## 2021-09-28 ENCOUNTER — Other Ambulatory Visit: Payer: Self-pay

## 2021-09-28 ENCOUNTER — Ambulatory Visit
Admission: RE | Admit: 2021-09-28 | Discharge: 2021-09-28 | Disposition: A | Discharge status: Home / Self Care | Payer: No Typology Code available for payment source | Source: Ambulatory Visit | Attending: Obstetrics and Gynecology | Admitting: Obstetrics and Gynecology

## 2021-09-28 DIAGNOSIS — N6311 Unspecified lump in the right breast, upper outer quadrant: Secondary | ICD-10-CM

## 2022-09-04 ENCOUNTER — Other Ambulatory Visit: Payer: Self-pay | Admitting: Obstetrics and Gynecology

## 2022-09-04 DIAGNOSIS — Z1231 Encounter for screening mammogram for malignant neoplasm of breast: Secondary | ICD-10-CM

## 2022-09-17 ENCOUNTER — Ambulatory Visit: Payer: Self-pay | Admitting: General Surgery

## 2022-09-17 NOTE — H&P (View-Only) (Signed)
PATIENT PROFILE: Amber Blair is a 52 y.o. female who presents to the Clinic for consultation at the request of Dr. Delice Lesch for evaluation of lipoma of her back.  PCP:  Sallee Lange, NP  HISTORY OF PRESENT ILLNESS: Amber Blair reports she has had a soft tissue mass in her back for many years.  She endorses that he has been increasing in size.  It got to the point that now she feels pain and discomfort when she rides again a flat surface.  Pain is localized to the left mid back.  No pain radiation.  Pain aggravated by applying pressure.  No alleviating factors.  Patient denies any previous procedure on that area.   PROBLEM LIST: Problem List  Date Reviewed: 03/19/2022          Noted   Situational mixed anxiety and depressive disorder 03/14/2020   Chronic pain of left knee 11/15/2019   Chronic pain of right knee 11/15/2019   Ulcerative colitis (CMS-HCC) Unknown   Seasonal allergies Unknown   Impaired glucose tolerance 04/21/2018   Overview    HgbA1c 5.8% 04/21/2018      Vitamin D deficiency 04/21/2018   Overview    25-OH vitamin D level 25.5 on 04/21/2018      Mixed hyperlipidemia 04/12/2016   Benign essential HTN 04/12/2016    GENERAL REVIEW OF SYSTEMS:   General ROS: negative for - chills, fatigue, fever, weight gain or weight loss Allergy and Immunology ROS: negative for - hives  Hematological and Lymphatic ROS: negative for - bleeding problems or bruising, negative for palpable nodes Endocrine ROS: negative for - heat or cold intolerance, hair changes Respiratory ROS: negative for - cough, shortness of breath or wheezing Cardiovascular ROS: no chest pain or palpitations GI ROS: negative for nausea, vomiting, abdominal pain, diarrhea, constipation Musculoskeletal ROS: negative for - joint swelling or muscle pain Neurological ROS: negative for - confusion, syncope Dermatological ROS: negative for pruritus and rash Psychiatric: negative for anxiety, depression,  difficulty sleeping and memory loss  MEDICATIONS: Current Outpatient Medications  Medication Sig Dispense Refill   buPROPion (WELLBUTRIN XL) 150 MG XL tablet TAKE 1 TABLET BY MOUTH EVERY DAY 90 tablet 1   cholecalciferol, vitamin D3, (VITAMIN D3) 125 mcg (5,000 unit) tablet Take 1 tablet by mouth once daily     clobetasoL (TEMOVATE) 0.05 % cream Apply topically as needed     diphenhydramine HCl (ALLERGY MEDICATION ORAL) Take 1 tablet by mouth once daily     ELDERBERRY FRUIT ORAL 1 gummy by mouth sometimes     hydroCHLOROthiazide (HYDRODIURIL) 12.5 MG tablet TAKE 1 TABLET BY MOUTH EVERY DAY 90 tablet 1   losartan (COZAAR) 100 MG tablet TAKE 1 TABLET BY MOUTH EVERY DAY 90 tablet 1   norethindrone-ethinyl estradiol (JUNEL 1/20) 1-20 mg-mcg tablet Take 1 tablet by mouth once daily 84 tablet 4   omeprazole (PRILOSEC) 40 MG DR capsule TAKE 1 CAP BY MOUTH ONCE DAILY FIRST THING IN AM WITH WATER-WAIT 30 MINS BEFORE EAT/DRINK/OTHER MEDS 90 capsule 1   No current facility-administered medications for this visit.    ALLERGIES: Patient has no known allergies.  PAST MEDICAL HISTORY: Past Medical History:  Diagnosis Date   Chronic tension-type headache, not intractable 05/09/2016   COVID-19 08/2020   History of blood transfusion 09/2001   During surgery   Hyperlipidemia    Hypertension    Impaired glucose tolerance 04/21/2018   HgbA1c 5.8% 04/21/2018   Seasonal allergies    Situational mixed anxiety  and depressive disorder 03/14/2020   Tinnitus of left ear 11/15/2019   Ulcerative colitis (CMS-HCC)    Vitamin D deficiency 04/21/2018   25-OH vitamin D level 25.5 on 04/21/2018    PAST SURGICAL HISTORY: Past Surgical History:  Procedure Laterality Date   COLECTOMY PARTIAL W/ANASTAMOSIS  2002   at [facility]  Has J-pouch which attaches to her small intestines.   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  Low grade dysplasia is present; no invasive carcinoma.   CHOLECYSTECTOMY  06/19/2020   Dr Lesli Albee -- ROBOTIC   COLON SURGERY  Feb 2003   Had lg intestines removed   TONSILLECTOMY       FAMILY HISTORY: Family History  Problem Relation Age of Onset   High blood pressure (Hypertension) Mother    Osteoporosis (Thinning of bones) Mother    Stroke Mother    Lung cancer Mother        mets to liver   Cancer Mother        Lung & Liver Cancer 2022   Asthma Father    High blood pressure (Hypertension) Father    Glaucoma Father    Obesity Father    Diabetes type I Daughter 73   Heart failure Maternal Grandmother    Heart failure Maternal Grandfather    Breast cancer Paternal Grandmother    Colon cancer Paternal Grandmother    Diabetes type I Nephew 15   GI problems Niece        Some type of bowel blockage issues     SOCIAL HISTORY: Social History   Socioeconomic History   Marital status: Married    Spouse name: Amber Blair   Number of children: 2  Occupational History   Occupation: Press photographer  Tobacco Use   Smoking status: Never   Smokeless tobacco: Never  Vaping Use   Vaping Use: Never used  Substance and Sexual Activity   Alcohol use: Yes    Comment: Only occasionally   Drug use: Never   Sexual activity: Yes    Partners: Male    Birth control/protection: Other-see comments    Comment: Birth control pills  Other Topics Concern   Would you please tell us about the people who live in your home, your pets, or anything else important to your social life? Yes    Comment: Husband    PHYSICAL EXAM: Vitals:   09/17/22 0840  BP: (!) 147/92  Pulse: 83   Body mass index is 29.8 kg/m. Weight: 85 kg (187 lb 6.3 oz)   GENERAL: Alert, active, oriented  x3  HEENT: Pupils equal reactive to light. Extraocular movements are intact. Sclera clear. Palpebral conjunctiva normal red color.Pharynx clear.  NECK: Supple with no palpable mass and no adenopathy.  LUNGS: Sound clear with no rales rhonchi or wheezes.  HEART: Regular rhythm S1 and S2 without murmur.  ABDOMEN: Soft and depressible, nontender with no palpable mass, no hepatomegaly.   BACK: There is a 13 x 6 cm oval-shaped soft tissue mass in the mid left back.  EXTREMITIES: Well-developed well-nourished symmetrical with no dependent edema.  NEUROLOGICAL: Awake alert oriented, facial expression symmetrical, moving all extremities.  REVIEW OF DATA: I have reviewed the following data today: Office Visit on 09/03/2022  Component Date  Value   Diagnostic Interpretation 09/03/2022 Comment    Specimen adequacy: - Lab* 09/03/2022 Comment    Clinician provided ICD10* 09/03/2022 Comment    PERFORMED BY: - [facility] 09/03/2022 Comment    . - [facility] 09/03/2022 .    Note: - [facility] 09/03/2022 Comment    Test Method MT21 - [facility] 09/03/2022 Comment    HPV APTIMA - [facility] 09/03/2022 Negative    HPV Genotype Reflex - [facility] 09/03/2022 Comment      ASSESSMENT: Ms. Julson is a 52 y.o. female presenting for consultation for soft tissue mass of back.  Patient with a mid left back soft tissue mass.  He has been growing in size for many years.  Not to the point of being at least 13 cm time 6 cm.  This is now causing symptoms with pain and discomfort upon pressure.  This is also affecting her sleep.  No previous history of infection or any procedure on that area.  We discussed about surgical removal of this mass.  He seems to be deep to the intramuscular space.  Patient will need at least deep sedation in addition to local anesthesia.  I discussed with patient the risks of surgery that include bleeding, infection, pain, recurrence, among others.  The patient reports she understood and agreed to proceed with  surgical excision of left back soft tissue mass.  Mass of soft tissue [M79.89]  PLAN: Excision of soft tissue mass of the back (21933) Avoid taking aspirin 5 days before surgery Contact us if you have any concern.   Patient verbalized understanding, all questions were answered, and were agreeable with the plan outlined above.     Herbert Pun, MD  Electronically signed by Herbert Pun, MD

## 2022-09-17 NOTE — H&P (Signed)
PATIENT PROFILE: Amber Blair is a 52 y.o. female who presents to the Clinic for consultation at the request of Dr. Delice Lesch for evaluation of lipoma of her back.  PCP:  Sallee Lange, NP  HISTORY OF PRESENT ILLNESS: Ms. Winkelman reports she has had a soft tissue mass in her back for many years.  She endorses that he has been increasing in size.  It got to the point that now she feels pain and discomfort when she rides again a flat surface.  Pain is localized to the left mid back.  No pain radiation.  Pain aggravated by applying pressure.  No alleviating factors.  Patient denies any previous procedure on that area.   PROBLEM LIST: Problem List  Date Reviewed: 03/19/2022          Noted   Situational mixed anxiety and depressive disorder 03/14/2020   Chronic pain of left knee 11/15/2019   Chronic pain of right knee 11/15/2019   Ulcerative colitis (CMS-HCC) Unknown   Seasonal allergies Unknown   Impaired glucose tolerance 04/21/2018   Overview    HgbA1c 5.8% 04/21/2018      Vitamin D deficiency 04/21/2018   Overview    25-OH vitamin D level 25.5 on 04/21/2018      Mixed hyperlipidemia 04/12/2016   Benign essential HTN 04/12/2016    GENERAL REVIEW OF SYSTEMS:   General ROS: negative for - chills, fatigue, fever, weight gain or weight loss Allergy and Immunology ROS: negative for - hives  Hematological and Lymphatic ROS: negative for - bleeding problems or bruising, negative for palpable nodes Endocrine ROS: negative for - heat or cold intolerance, hair changes Respiratory ROS: negative for - cough, shortness of breath or wheezing Cardiovascular ROS: no chest pain or palpitations GI ROS: negative for nausea, vomiting, abdominal pain, diarrhea, constipation Musculoskeletal ROS: negative for - joint swelling or muscle pain Neurological ROS: negative for - confusion, syncope Dermatological ROS: negative for pruritus and rash Psychiatric: negative for anxiety, depression,  difficulty sleeping and memory loss  MEDICATIONS: Current Outpatient Medications  Medication Sig Dispense Refill   buPROPion (WELLBUTRIN XL) 150 MG XL tablet TAKE 1 TABLET BY MOUTH EVERY DAY 90 tablet 1   cholecalciferol, vitamin D3, (VITAMIN D3) 125 mcg (5,000 unit) tablet Take 1 tablet by mouth once daily     clobetasoL (TEMOVATE) 0.05 % cream Apply topically as needed     diphenhydramine HCl (ALLERGY MEDICATION ORAL) Take 1 tablet by mouth once daily     ELDERBERRY FRUIT ORAL 1 gummy by mouth sometimes     hydroCHLOROthiazide (HYDRODIURIL) 12.5 MG tablet TAKE 1 TABLET BY MOUTH EVERY DAY 90 tablet 1   losartan (COZAAR) 100 MG tablet TAKE 1 TABLET BY MOUTH EVERY DAY 90 tablet 1   norethindrone-ethinyl estradiol (JUNEL 1/20) 1-20 mg-mcg tablet Take 1 tablet by mouth once daily 84 tablet 4   omeprazole (PRILOSEC) 40 MG DR capsule TAKE 1 CAP BY MOUTH ONCE DAILY FIRST THING IN AM WITH WATER-WAIT 30 MINS BEFORE EAT/DRINK/OTHER MEDS 90 capsule 1   No current facility-administered medications for this visit.    ALLERGIES: Patient has no known allergies.  PAST MEDICAL HISTORY: Past Medical History:  Diagnosis Date   Chronic tension-type headache, not intractable 05/09/2016   COVID-19 08/2020   History of blood transfusion 09/2001   During surgery   Hyperlipidemia    Hypertension    Impaired glucose tolerance 04/21/2018   HgbA1c 5.8% 04/21/2018   Seasonal allergies    Situational mixed anxiety  and depressive disorder 03/14/2020   Tinnitus of left ear 11/15/2019   Ulcerative colitis (CMS-HCC)    Vitamin D deficiency 04/21/2018   25-OH vitamin D level 25.5 on 04/21/2018    PAST SURGICAL HISTORY: Past Surgical History:  Procedure Laterality Date   COLECTOMY PARTIAL W/ANASTAMOSIS  2002   at [facility].  Has J-pouch which attaches to her small intestines.   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  Low grade dysplasia is present; no invasive carcinoma.   CHOLECYSTECTOMY  06/19/2020   Dr Lesli Albee -- ROBOTIC   COLON SURGERY  Feb 2003   Had lg intestines removed   TONSILLECTOMY       FAMILY HISTORY: Family History  Problem Relation Age of Onset   High blood pressure (Hypertension) Mother    Osteoporosis (Thinning of bones) Mother    Stroke Mother    Lung cancer Mother        mets to liver   Cancer Mother        Lung & Liver Cancer 2022   Asthma Father    High blood pressure (Hypertension) Father    Glaucoma Father    Obesity Father    Diabetes type I Daughter 9   Heart failure Maternal Grandmother    Heart failure Maternal Grandfather    Breast cancer Paternal Grandmother    Colon cancer Paternal Grandmother    Diabetes type I Nephew 15   GI problems Niece        Some type of bowel blockage issues     SOCIAL HISTORY: Social History   Socioeconomic History   Marital status: Married    Spouse name: Gerald Stabs   Number of children: 2  Occupational History   Occupation: Press photographer  Tobacco Use   Smoking status: Never   Smokeless tobacco: Never  Vaping Use   Vaping Use: Never used  Substance and Sexual Activity   Alcohol use: Yes    Comment: Only occasionally   Drug use: Never   Sexual activity: Yes    Partners: Male    Birth control/protection: Other-see comments    Comment: Birth control pills  Other Topics Concern   Would you please tell us about the people who live in your home, your pets, or anything else important to your social life? Yes    Comment: Husband    PHYSICAL EXAM: Vitals:   09/17/22 0840  BP: (!) 147/92  Pulse: 83   Body mass index is 29.8 kg/m. Weight: 85 kg (187 lb 6.3 oz)   GENERAL: Alert, active, oriented  x3  HEENT: Pupils equal reactive to light. Extraocular movements are intact. Sclera clear. Palpebral conjunctiva normal red color.Pharynx clear.  NECK: Supple with no palpable mass and no adenopathy.  LUNGS: Sound clear with no rales rhonchi or wheezes.  HEART: Regular rhythm S1 and S2 without murmur.  ABDOMEN: Soft and depressible, nontender with no palpable mass, no hepatomegaly.   BACK: There is a 13 x 6 cm oval-shaped soft tissue mass in the mid left back.  EXTREMITIES: Well-developed well-nourished symmetrical with no dependent edema.  NEUROLOGICAL: Awake alert oriented, facial expression symmetrical, moving all extremities.  REVIEW OF DATA: I have reviewed the following data today: Office Visit on 09/03/2022  Component Date  Value   Diagnostic Interpretation 09/03/2022 Comment    Specimen adequacy: - Lab* 09/03/2022 Comment    Clinician provided ICD10* 09/03/2022 Comment    PERFORMED BY: - [facility] 09/03/2022 Comment    . - [facility] 09/03/2022 .    Note: - [facility] 09/03/2022 Comment    Test Method MT21 - [facility] 09/03/2022 Comment    HPV APTIMA - [facility] 09/03/2022 Negative    HPV Genotype Reflex - [facility] 09/03/2022 Comment      ASSESSMENT: Ms. Pedrotti is a 52 y.o. female presenting for consultation for soft tissue mass of back.  Patient with a mid left back soft tissue mass.  He has been growing in size for many years.  Not to the point of being at least 13 cm time 6 cm.  This is now causing symptoms with pain and discomfort upon pressure.  This is also affecting her sleep.  No previous history of infection or any procedure on that area.  We discussed about surgical removal of this mass.  He seems to be deep to the intramuscular space.  Patient will need at least deep sedation in addition to local anesthesia.  I discussed with patient the risks of surgery that include bleeding, infection, pain, recurrence, among others.  The patient reports she understood and agreed to proceed with  surgical excision of left back soft tissue mass.  Mass of soft tissue [M79.89]  PLAN: Excision of soft tissue mass of the back (21933) Avoid taking aspirin 5 days before surgery Contact us if you have any concern.   Patient verbalized understanding, all questions were answered, and were agreeable with the plan outlined above.     Herbert Pun, MD  Electronically signed by Herbert Pun, MD

## 2022-09-25 ENCOUNTER — Encounter
Admission: RE | Admit: 2022-09-25 | Discharge: 2022-09-25 | Disposition: A | Discharge status: Home / Self Care | Payer: Self-pay | Source: Ambulatory Visit | Attending: General Surgery | Admitting: General Surgery

## 2022-09-25 VITALS — Ht 67.0 in | Wt 193.1 lb

## 2022-09-25 DIAGNOSIS — Z01818 Encounter for other preprocedural examination: Secondary | ICD-10-CM

## 2022-09-25 DIAGNOSIS — I10 Essential (primary) hypertension: Secondary | ICD-10-CM

## 2022-09-25 DIAGNOSIS — Z01810 Encounter for preprocedural cardiovascular examination: Secondary | ICD-10-CM

## 2022-09-25 HISTORY — DX: Gastro-esophageal reflux disease without esophagitis: K21.9

## 2022-09-25 HISTORY — DX: Anemia, unspecified: D64.9

## 2022-09-25 NOTE — Patient Instructions (Signed)
Your procedure is scheduled on:10-04-22 Friday Report to the Registration Desk on the 1st floor of the [facility].Then proceed to the 2nd floor Surgery Desk To find out your arrival time, please call [phone] between 1PM - 3PM on:10-03-22 Thursday If your arrival time is 6:00 am, do not arrive before that time as the [facility] entrance doors do not open until 6:00 am.  REMEMBER: Instructions that are not followed completely may result in serious medical risk, up to and including death; or upon the discretion of your surgeon and anesthesiologist your surgery may need to be rescheduled.  Do not eat food OR drink any liquids after midnight the night before surgery.  No gum chewing or hard candies.  One week prior to surgery: Stop Anti-inflammatories (NSAIDS) such as Advil, Aleve, Ibuprofen, Motrin, Naproxen, Naprosyn and Aspirin based products such as Excedrin, Goody's Powder, BC Powder.You may however, continue to take Tylenol if needed for pain up until the day of surgery.  Stop ANY OVER THE COUNTER supplements/vitamins NOW (09-25-22) until after surgery (Elderberry and Vitamin D3)  TAKE ONLY THESE MEDICATIONS THE MORNING OF SURGERY WITH A SIP OF WATER: -buPROPion (WELLBUTRIN XL)  -cetirizine (ZYRTEC)  -omeprazole (PRILOSEC)-take one the night before your surgery and one the morning of surgery  No Alcohol for 24 hours before or after surgery.  No Smoking including e-cigarettes for 24 hours before surgery.  No chewable tobacco products for at least 6 hours before surgery.  No nicotine patches on the day of surgery.  Do not use any "recreational" drugs for at least a week (preferably 2 weeks) before your surgery.  Please be advised that the combination of cocaine and anesthesia may have negative outcomes, up to and including death. If you test positive for cocaine, your surgery will be cancelled.  On the morning of surgery brush your teeth with toothpaste and water, you may rinse  your mouth with mouthwash if you wish. Do not swallow any toothpaste or mouthwash.  Use CHG Soap as directed on instruction sheet.  Do not wear jewelry, make-up, hairpins, clips or nail polish.  Do not wear lotions, powders, or perfumes.   Do not shave body hair from the neck down 48 hours before surgery.  Contact lenses, hearing aids and dentures may not be worn into surgery.  Do not bring valuables to the hospital. [facility] is not responsible for any missing/lost belongings or valuables.   Notify your doctor if there is any change in your medical condition (cold, fever, infection).  Wear comfortable clothing (specific to your surgery type) to the hospital.  After surgery, you can help prevent lung complications by doing breathing exercises.  Take deep breaths and cough every 1-2 hours. Your doctor may order a device called an Incentive Spirometer to help you take deep breaths. When coughing or sneezing, hold a pillow firmly against your incision with both hands. This is called "splinting." Doing this helps protect your incision. It also decreases belly discomfort.  If you are being admitted to the hospital overnight, leave your suitcase in the car. After surgery it may be brought to your room.  In case of increased patient census, it may be necessary for you, the patient, to continue your postoperative care in the Same Day Surgery department.  If you are being discharged the day of surgery, you will not be allowed to drive home. You will need a responsible individual to drive you home and stay with you for 24 hours after surgery.  If you are taking public transportation, you will need to have a responsible individual with you.  Please call the [facility] Dept. at [phone] if you have any questions about these instructions.  Surgery Visitation Policy:  Patients undergoing a surgery or procedure may have two family members or support persons with them as long  as the person is not COVID-19 positive or experiencing its symptoms.   Due to an increase in RSV and influenza rates and associated hospitalizations, children ages 59 and under will not be able to visit patients in [facility]. Masks continue to be strongly recommended.     Preparing for Surgery with CHLORHEXIDINE GLUCONATE (CHG) Soap  Chlorhexidine Gluconate (CHG) Soap  o An antiseptic cleaner that kills germs and bonds with the skin to continue killing germs even after washing  o Used for showering the night before surgery and morning of surgery  Before surgery, you can play an important role by reducing the number of germs on your skin.  CHG (Chlorhexidine gluconate) soap is an antiseptic cleanser which kills germs and bonds with the skin to continue killing germs even after washing.  Please do not use if you have an allergy to CHG or antibacterial soaps. If your skin becomes reddened/irritated stop using the CHG.  1. Shower the NIGHT BEFORE SURGERY and the MORNING OF SURGERY with CHG soap.  2. If you choose to wash your hair, wash your hair first as usual with your normal shampoo.  3. After shampooing, rinse your hair and body thoroughly to remove the shampoo.  4. Use CHG as you would any other liquid soap. You can apply CHG directly to the skin and wash gently with a scrungie or a clean washcloth.  5. Apply the CHG soap to your body only from the neck down. Do not use on open wounds or open sores. Avoid contact with your eyes, ears, mouth, and genitals (private parts). Wash face and genitals (private parts) with your normal soap.  6. Wash thoroughly, paying special attention to the area where your surgery will be performed.  7. Thoroughly rinse your body with warm water.  8. Do not shower/wash with your normal soap after using and rinsing off the CHG soap.  9. Pat yourself dry with a clean towel.  10. Wear clean pajamas to bed the night before surgery.  12.  Place clean sheets on your bed the night of your first shower and do not sleep with pets.  13. Shower again with the CHG soap on the day of surgery prior to arriving at the hospital.  14. Do not apply any deodorants/lotions/powders.  15. Please wear clean clothes to the hospital.

## 2022-09-26 ENCOUNTER — Inpatient Hospital Stay: Admission: RE | Admit: 2022-09-26 | Payer: No Typology Code available for payment source | Source: Ambulatory Visit

## 2022-09-30 ENCOUNTER — Encounter
Admission: RE | Admit: 2022-09-30 | Discharge: 2022-09-30 | Disposition: A | Discharge status: Home / Self Care | Payer: No Typology Code available for payment source | Source: Ambulatory Visit | Attending: General Surgery

## 2022-09-30 DIAGNOSIS — I10 Essential (primary) hypertension: Secondary | ICD-10-CM | POA: Insufficient documentation

## 2022-09-30 DIAGNOSIS — Z01810 Encounter for preprocedural cardiovascular examination: Secondary | ICD-10-CM | POA: Diagnosis present

## 2022-10-03 ENCOUNTER — Ambulatory Visit
Admission: RE | Admit: 2022-10-03 | Discharge: 2022-10-03 | Disposition: A | Discharge status: Home / Self Care | Payer: No Typology Code available for payment source | Source: Ambulatory Visit | Attending: Obstetrics and Gynecology | Admitting: Obstetrics and Gynecology

## 2022-10-03 DIAGNOSIS — Z1231 Encounter for screening mammogram for malignant neoplasm of breast: Secondary | ICD-10-CM | POA: Diagnosis present

## 2022-10-03 MED ORDER — CEFAZOLIN SODIUM-DEXTROSE 2-4 GM/100ML-% IV SOLN
2.0000 g | INTRAVENOUS | Status: AC
  Administered 2022-10-04: 2 g via INTRAVENOUS

## 2022-10-03 MED ORDER — ORAL CARE MOUTH RINSE: 15.0000 mL | Freq: Once | OROMUCOSAL | Status: AC

## 2022-10-03 MED ORDER — LACTATED RINGERS IV SOLN: INTRAVENOUS | Status: DC

## 2022-10-03 MED ORDER — CHLORHEXIDINE GLUCONATE 0.12 % MT SOLN: 15.0000 mL | Freq: Once | OROMUCOSAL | Status: AC

## 2022-10-04 ENCOUNTER — Ambulatory Visit: Payer: No Typology Code available for payment source | Admitting: Urgent Care

## 2022-10-04 ENCOUNTER — Ambulatory Visit
Admission: RE | Admit: 2022-10-04 | Discharge: 2022-10-04 | Disposition: A | Discharge status: Home / Self Care | Payer: No Typology Code available for payment source | Attending: General Surgery | Admitting: General Surgery

## 2022-10-04 ENCOUNTER — Other Ambulatory Visit: Payer: Self-pay

## 2022-10-04 ENCOUNTER — Ambulatory Visit: Payer: No Typology Code available for payment source | Admitting: Certified Registered"

## 2022-10-04 ENCOUNTER — Encounter
Admission: RE | Disposition: A | Discharge status: Home / Self Care | Payer: Self-pay | Source: Home / Self Care | Attending: General Surgery

## 2022-10-04 ENCOUNTER — Encounter: Payer: Self-pay | Admitting: General Surgery

## 2022-10-04 DIAGNOSIS — D171 Benign lipomatous neoplasm of skin and subcutaneous tissue of trunk: Secondary | ICD-10-CM | POA: Diagnosis present

## 2022-10-04 DIAGNOSIS — Z01818 Encounter for other preprocedural examination: Secondary | ICD-10-CM

## 2022-10-04 DIAGNOSIS — I10 Essential (primary) hypertension: Secondary | ICD-10-CM | POA: Diagnosis not present

## 2022-10-04 DIAGNOSIS — K219 Gastro-esophageal reflux disease without esophagitis: Secondary | ICD-10-CM | POA: Diagnosis not present

## 2022-10-04 HISTORY — PX: MASS EXCISION: SHX2000

## 2022-10-04 LAB — POCT PREGNANCY, URINE: Preg Test, Ur: NEGATIVE

## 2022-10-04 SURGERY — EXCISION MASS
Anesthesia: General | Site: Back | Wound class: Clean

## 2022-10-04 MED ORDER — OXYCODONE HCL 5 MG PO TABS: 5.0000 mg | ORAL_TABLET | Freq: Once | ORAL | Status: AC | PRN

## 2022-10-04 MED ORDER — MIDAZOLAM HCL 2 MG/2ML IJ SOLN
INTRAMUSCULAR | Status: AC
  Filled 2022-10-04: qty 2

## 2022-10-04 MED ORDER — HYDROCODONE-ACETAMINOPHEN 5-325 MG PO TABS: 1.0000 | ORAL_TABLET | ORAL | 0 refills | Status: AC | PRN

## 2022-10-04 MED ORDER — SUGAMMADEX SODIUM 200 MG/2ML IV SOLN
INTRAVENOUS | Status: DC | PRN
  Administered 2022-10-04: 200 mg via INTRAVENOUS

## 2022-10-04 MED ORDER — CEFAZOLIN SODIUM-DEXTROSE 2-4 GM/100ML-% IV SOLN
INTRAVENOUS | Status: AC
  Filled 2022-10-04: qty 100

## 2022-10-04 MED ORDER — DEXAMETHASONE SODIUM PHOSPHATE 10 MG/ML IJ SOLN
INTRAMUSCULAR | Status: DC | PRN
  Administered 2022-10-04: 10 mg via INTRAVENOUS

## 2022-10-04 MED ORDER — BUPIVACAINE HCL (PF) 0.5 % IJ SOLN
INTRAMUSCULAR | Status: AC
  Filled 2022-10-04: qty 30

## 2022-10-04 MED ORDER — PROPOFOL 10 MG/ML IV BOLUS
INTRAVENOUS | Status: DC | PRN
  Administered 2022-10-04: 150 mg via INTRAVENOUS

## 2022-10-04 MED ORDER — DEXMEDETOMIDINE HCL IN NACL 80 MCG/20ML IV SOLN
INTRAVENOUS | Status: DC | PRN
  Administered 2022-10-04: 8 ug via BUCCAL
  Administered 2022-10-04: 4 ug via BUCCAL
  Administered 2022-10-04: 8 ug via BUCCAL

## 2022-10-04 MED ORDER — ROCURONIUM BROMIDE 100 MG/10ML IV SOLN
INTRAVENOUS | Status: DC | PRN
  Administered 2022-10-04: 50 mg via INTRAVENOUS

## 2022-10-04 MED ORDER — OXYCODONE HCL 5 MG/5ML PO SOLN: 5.0000 mg | Freq: Once | ORAL | Status: AC | PRN

## 2022-10-04 MED ORDER — ONDANSETRON HCL 4 MG/2ML IJ SOLN
INTRAMUSCULAR | Status: DC | PRN
  Administered 2022-10-04: 4 mg via INTRAVENOUS

## 2022-10-04 MED ORDER — KETOROLAC TROMETHAMINE 30 MG/ML IJ SOLN
INTRAMUSCULAR | Status: DC | PRN
  Administered 2022-10-04: 30 mg via INTRAVENOUS

## 2022-10-04 MED ORDER — LIDOCAINE HCL (CARDIAC) PF 100 MG/5ML IV SOSY
PREFILLED_SYRINGE | INTRAVENOUS | Status: DC | PRN
  Administered 2022-10-04: 20 mg via INTRAVENOUS
  Administered 2022-10-04: 80 mg via INTRAVENOUS

## 2022-10-04 MED ORDER — CHLORHEXIDINE GLUCONATE 0.12 % MT SOLN
OROMUCOSAL | Status: AC
  Administered 2022-10-04: 15 mL via OROMUCOSAL
  Filled 2022-10-04: qty 15

## 2022-10-04 MED ORDER — OXYCODONE HCL 5 MG PO TABS
ORAL_TABLET | ORAL | Status: AC
  Administered 2022-10-04: 5 mg via ORAL
  Filled 2022-10-04: qty 1

## 2022-10-04 MED ORDER — FENTANYL CITRATE (PF) 100 MCG/2ML IJ SOLN
INTRAMUSCULAR | Status: DC | PRN
  Administered 2022-10-04 (×2): 50 ug via INTRAVENOUS

## 2022-10-04 MED ORDER — BUPIVACAINE HCL (PF) 0.5 % IJ SOLN
INTRAMUSCULAR | Status: DC | PRN
  Administered 2022-10-04: 30 mL

## 2022-10-04 MED ORDER — FENTANYL CITRATE (PF) 100 MCG/2ML IJ SOLN: 25.0000 ug | INTRAMUSCULAR | Status: DC | PRN

## 2022-10-04 MED ORDER — FENTANYL CITRATE (PF) 100 MCG/2ML IJ SOLN
INTRAMUSCULAR | Status: AC
  Filled 2022-10-04: qty 2

## 2022-10-04 MED ORDER — MIDAZOLAM HCL 2 MG/2ML IJ SOLN
INTRAMUSCULAR | Status: DC | PRN
  Administered 2022-10-04: 2 mg via INTRAVENOUS

## 2022-10-04 MED ORDER — PROPOFOL 1000 MG/100ML IV EMUL
INTRAVENOUS | Status: AC
  Filled 2022-10-04: qty 100

## 2022-10-04 SURGICAL SUPPLY — 28 items
ADH SKN CLS APL DERMABOND .7 (GAUZE/BANDAGES/DRESSINGS) ×1
APL PRP STRL LF DISP 70% ISPRP (MISCELLANEOUS) ×1
CHLORAPREP W/TINT 26 (MISCELLANEOUS) ×1 IMPLANT
DERMABOND ADVANCED .7 DNX12 (GAUZE/BANDAGES/DRESSINGS) ×1 IMPLANT
DRAPE LAPAROTOMY 100X77 ABD (DRAPES) ×1 IMPLANT
ELECT CAUTERY BLADE 6.4 (BLADE) ×1 IMPLANT
ELECT REM PT RETURN 9FT ADLT (ELECTROSURGICAL) ×1
ELECTRODE REM PT RTRN 9FT ADLT (ELECTROSURGICAL) ×1 IMPLANT
GLOVE BIO SURGEON STRL SZ 6.5 (GLOVE) ×1 IMPLANT
GLOVE BIOGEL PI IND STRL 6.5 (GLOVE) ×1 IMPLANT
GOWN STRL REUS W/ TWL LRG LVL3 (GOWN DISPOSABLE) ×2 IMPLANT
GOWN STRL REUS W/TWL LRG LVL3 (GOWN DISPOSABLE) ×5
KIT TURNOVER KIT A (KITS) ×1 IMPLANT
LABEL OR SOLS (LABEL) ×1 IMPLANT
MANIFOLD NEPTUNE II (INSTRUMENTS) ×1 IMPLANT
NDL HYPO 25X1 1.5 SAFETY (NEEDLE) ×1 IMPLANT
NEEDLE HYPO 25X1 1.5 SAFETY (NEEDLE) ×1 IMPLANT
NS IRRIG 500ML POUR BTL (IV SOLUTION) ×1 IMPLANT
PACK BASIN MINOR ARMC (MISCELLANEOUS) ×1 IMPLANT
SUT ETHILON 3-0 (SUTURE) IMPLANT
SUT MNCRL 4-0 (SUTURE) ×1
SUT MNCRL 4-0 27XMFL (SUTURE) ×1
SUT VIC AB 3-0 SH 27 (SUTURE) ×2
SUT VIC AB 3-0 SH 27X BRD (SUTURE) ×1 IMPLANT
SUTURE MNCRL 4-0 27XMF (SUTURE) ×1 IMPLANT
SYR 10ML LL (SYRINGE) ×1 IMPLANT
TRAP FLUID SMOKE EVACUATOR (MISCELLANEOUS) ×1 IMPLANT
WATER STERILE IRR 500ML POUR (IV SOLUTION) ×1 IMPLANT

## 2022-10-04 NOTE — Interval H&P Note (Signed)
History and Physical Interval Note:  [id number] PM  Amber Blair  has presented today for surgery, with the diagnosis of M79.89 mass of soft tissue.  The various methods of treatment have been discussed with the patient and family. After consideration of risks, benefits and other options for treatment, the patient has consented to  Procedure(s): EXCISION MASS (N/A) as a surgical intervention.  The patient's history has been reviewed, patient examined, no change in status, stable for surgery.  I have reviewed the patient's chart and labs.  Questions were answered to the patient's satisfaction.     Herbert Pun

## 2022-10-04 NOTE — Op Note (Addendum)
OPERATION REPORT  Pre Operative Diagnosis: Lipoma of back  Post operative diagnosis: Lipoma of back  Anesthesia: General and Local   Surgeon: Dr. Windell Moment   Indication: This 52 y.o. year old female with a large soft tissue mass that is increasing in size and causing discomfort   Description of procedure: after orienting patient about the procedure steps and benefits and patient agreed to proceed. Time out was done identifying correct patient and location of procedure. After induction of monitored sedation, local anesthesia was infiltrated around the palpable lesion. With a blade #15, an elliptical incision was made using the skin lines. Sharp dissection was carried down to the subfascial and intramuscular tissue. The mas was excised completely. The mass measured 15 x 6 cm. Deep dermal stitches were done with vicryl 3-0 to repair the laceration and skin closed with Monocryl 4-0 in subcuticular fashion. Specimen sent to pathology.      Complications: none   EBL: minimal  Herbert Pun, MD, FACS

## 2022-10-04 NOTE — Anesthesia Postprocedure Evaluation (Signed)
Anesthesia Post Note  Patient: Amber Blair  Procedure(s) Performed: EXCISION MASS (Back)  Patient location during evaluation: PACU Anesthesia Type: General Level of consciousness: awake and alert Pain management: pain level controlled Vital Signs Assessment: post-procedure vital signs reviewed and stable Respiratory status: spontaneous breathing, nonlabored ventilation, respiratory function stable and patient connected to nasal cannula oxygen Cardiovascular status: blood pressure returned to baseline and stable Postop Assessment: no apparent nausea or vomiting Anesthetic complications: no   There were no known notable events for this encounter.   Last Vitals:  Vitals:   10/04/22 1345 10/04/22 1355  BP: 126/74   Pulse: 83 82  Resp: 18 15  Temp:    SpO2: 100% 98%    Last Pain:  Vitals:   10/04/22 1355  TempSrc:   PainSc: 3                  Ilene Qua

## 2022-10-04 NOTE — Anesthesia Preprocedure Evaluation (Signed)
Anesthesia Evaluation  Patient identified by MRN, date of birth, ID band Patient awake    Reviewed: Allergy & Precautions, NPO status , Patient's Chart, lab work & pertinent test results  Airway Mallampati: III  TM Distance: >3 FB Neck ROM: full    Dental no notable dental hx.    Pulmonary neg pulmonary ROS   Pulmonary exam normal        Cardiovascular hypertension, On Medications negative cardio ROS Normal cardiovascular exam     Neuro/Psych negative neurological ROS  negative psych ROS   GI/Hepatic Neg liver ROS,GERD  Medicated,,  Endo/Other  negative endocrine ROS    Renal/GU      Musculoskeletal   Abdominal   Peds  Hematology negative hematology ROS (+)   Anesthesia Other Findings Past Medical History: No date: Anemia No date: GERD (gastroesophageal reflux disease) No date: Hypertension No date: Ulcerative colitis ([place])  Past Surgical History: 2021: CHOLECYSTECTOMY No date: COLONOSCOPY No date: ILEOSTOMY CLOSURE 2003: large intestine removed     Comment:  age 52 No date: TONSILLECTOMY     Comment:  age 52  BMI    Body Mass Index: 30.25 kg/m      Reproductive/Obstetrics negative OB ROS                             Anesthesia Physical Anesthesia Plan  ASA: 2  Anesthesia Plan: General ETT   Post-op Pain Management: Ofirmev IV (intra-op)* and Toradol IV (intra-op)*   Induction: Intravenous  PONV Risk Score and Plan: Ondansetron, Dexamethasone, Midazolam and Treatment may vary due to age or medical condition  Airway Management Planned: Oral ETT  Additional Equipment:   Intra-op Plan:   Post-operative Plan: Extubation in OR  Informed Consent: I have reviewed the patients History and Physical, chart, labs and discussed the procedure including the risks, benefits and alternatives for the proposed anesthesia with the patient or authorized representative who has  indicated his/her understanding and acceptance.     Dental Advisory Given  Plan Discussed with: Anesthesiologist, CRNA and Surgeon  Anesthesia Plan Comments: (Patient consented for risks of anesthesia including but not limited to:  - adverse reactions to medications - damage to eyes, teeth, lips or other oral mucosa - nerve damage due to positioning  - sore throat or hoarseness - Damage to heart, brain, nerves, lungs, other parts of body or loss of life  Patient voiced understanding.)        Anesthesia Quick Evaluation

## 2022-10-04 NOTE — Anesthesia Procedure Notes (Signed)
Procedure Name: Intubation Date/Time: 10/04/2022 12:47 PM  Performed by: Patience Musca., CRNAPre-anesthesia Checklist: Patient identified, Patient being monitored, Timeout performed, Emergency Drugs available and Suction available Patient Re-evaluated:Patient Re-evaluated prior to induction Oxygen Delivery Method: Circle system utilized Preoxygenation: Pre-oxygenation with 100% oxygen Induction Type: IV induction Ventilation: Mask ventilation without difficulty Laryngoscope Size: 3 and McGraph Grade View: Grade I Tube type: Oral Tube size: 6.5 mm Number of attempts: 1 Airway Equipment and Method: Stylet Placement Confirmation: ETT inserted through vocal cords under direct vision, positive ETCO2 and breath sounds checked- equal and bilateral Secured at: 21 cm Tube secured with: Tape Dental Injury: Teeth and Oropharynx as per pre-operative assessment

## 2022-10-04 NOTE — Discharge Instructions (Addendum)
  Diet: Resume home heart healthy regular diet.   Activity: Increase activity as tolerated. Light activity and walking are encouraged. Do not drive or drink alcohol if taking narcotic pain medications.  Wound care: May shower with soapy water and pat dry (do not rub incisions), but no baths or submerging incision underwater until follow-up. (no swimming)   Medications: Resume all home medications. For mild to moderate pain: acetaminophen (Tylenol) or ibuprofen (if no kidney disease). Combining Tylenol with alcohol can substantially increase your risk of causing liver disease. Narcotic pain medications, if prescribed, can be used for severe pain, though may cause nausea, constipation, and drowsiness. Do not combine Tylenol and Norco within a 6 hour period as Norco contains Tylenol. If you do not need the narcotic pain medication, you do not need to fill the prescription.  Call office ([phone]) at any time if any questions, worsening pain, fevers/chills, bleeding, drainage from incision site, or other concerns.   AMBULATORY SURGERY  DISCHARGE INSTRUCTIONS   The drugs that you were given will stay in your system until tomorrow so for the next 24 hours you should not:  Drive an automobile Make any legal decisions Drink any alcoholic beverage   You may resume regular meals tomorrow.  Today it is better to start with liquids and gradually work up to solid foods.  You may eat anything you prefer, but it is better to start with liquids, then soup and crackers, and gradually work up to solid foods.   Please notify your doctor immediately if you have any unusual bleeding, trouble breathing, redness and pain at the surgery site, drainage, fever, or pain not relieved by medication.    Additional Instructions:        Please contact your physician with any problems or Same Day Surgery at [phone], Monday through Friday 6 am to 4 pm, or [facility] Main number at  [phone]. 

## 2022-10-04 NOTE — Transfer of Care (Signed)
Immediate Anesthesia Transfer of Care Note  Patient: Amber Blair  Procedure(s) Performed: EXCISION MASS (Back)  Patient Location: PACU  Anesthesia Type:General  Level of Consciousness: drowsy  Airway & Oxygen Therapy: Patient Spontanous Breathing and Patient connected to face mask oxygen  Post-op Assessment: Report given to RN and Post -op Vital signs reviewed and stable  Post vital signs: Reviewed and stable  Last Vitals:  Vitals Value Taken Time  BP    Temp    Pulse    Resp    SpO2      Last Pain:  Vitals:   10/04/22 1020  TempSrc: Temporal  PainSc: 0-No pain         Complications: No notable events documented.

## 2022-10-05 ENCOUNTER — Encounter: Payer: Self-pay | Admitting: General Surgery

## 2022-10-07 LAB — SURGICAL PATHOLOGY

## 2022-10-15 ENCOUNTER — Other Ambulatory Visit: Payer: Self-pay | Admitting: Obstetrics and Gynecology

## 2022-10-15 DIAGNOSIS — R928 Other abnormal and inconclusive findings on diagnostic imaging of breast: Secondary | ICD-10-CM

## 2022-10-15 DIAGNOSIS — N63 Unspecified lump in unspecified breast: Secondary | ICD-10-CM

## 2022-10-16 ENCOUNTER — Ambulatory Visit
Admission: RE | Admit: 2022-10-16 | Discharge: 2022-10-16 | Disposition: A | Discharge status: Home / Self Care | Payer: No Typology Code available for payment source | Source: Ambulatory Visit | Attending: Obstetrics and Gynecology | Admitting: Obstetrics and Gynecology

## 2022-10-16 DIAGNOSIS — R928 Other abnormal and inconclusive findings on diagnostic imaging of breast: Secondary | ICD-10-CM

## 2022-10-16 DIAGNOSIS — N63 Unspecified lump in unspecified breast: Secondary | ICD-10-CM

## 2023-01-08 ENCOUNTER — Other Ambulatory Visit: Payer: Self-pay | Admitting: Internal Medicine

## 2023-01-08 DIAGNOSIS — E782 Mixed hyperlipidemia: Secondary | ICD-10-CM

## 2023-01-08 DIAGNOSIS — R0602 Shortness of breath: Secondary | ICD-10-CM

## 2023-01-08 DIAGNOSIS — R0789 Other chest pain: Secondary | ICD-10-CM

## 2023-01-09 ENCOUNTER — Ambulatory Visit
Admission: RE | Admit: 2023-01-09 | Discharge: 2023-01-09 | Disposition: A | Discharge status: Home / Self Care | Payer: No Typology Code available for payment source | Source: Ambulatory Visit | Attending: Internal Medicine | Admitting: Internal Medicine

## 2023-01-09 DIAGNOSIS — R0789 Other chest pain: Secondary | ICD-10-CM | POA: Insufficient documentation

## 2023-01-09 DIAGNOSIS — E782 Mixed hyperlipidemia: Secondary | ICD-10-CM | POA: Insufficient documentation

## 2023-01-09 DIAGNOSIS — R0602 Shortness of breath: Secondary | ICD-10-CM | POA: Insufficient documentation

## 2023-03-14 ENCOUNTER — Other Ambulatory Visit: Payer: Self-pay | Admitting: Obstetrics and Gynecology

## 2023-03-14 DIAGNOSIS — N63 Unspecified lump in unspecified breast: Secondary | ICD-10-CM

## 2023-04-21 ENCOUNTER — Ambulatory Visit
Admission: RE | Admit: 2023-04-21 | Discharge: 2023-04-21 | Disposition: A | Discharge status: Home / Self Care | Payer: No Typology Code available for payment source | Source: Ambulatory Visit | Attending: Obstetrics and Gynecology | Admitting: Obstetrics and Gynecology

## 2023-04-21 DIAGNOSIS — N63 Unspecified lump in unspecified breast: Secondary | ICD-10-CM | POA: Diagnosis present

## 2023-06-29 IMAGING — MG DIGITAL DIAGNOSTIC BILAT W/ TOMO W/ CAD
8 series · 8 of 24 positions shown · non-contrast
Comparison: Previous exam(s).

CLINICAL DATA: Interval follow-up of a likely benign mass involving
the UPPER OUTER QUADRANT of the RIGHT breast at the 9:30 o'clock
position 3 cm from the nipple at middle depth. Annual evaluation,
LEFT breast.

EXAM:
DIGITAL DIAGNOSTIC BILATERAL MAMMOGRAM WITH TOMOSYNTHESIS AND CAD;
ULTRASOUND RIGHT BREAST LIMITED
TECHNIQUE: Bilateral digital diagnostic mammography and breast tomosynthesis
was performed. The images were evaluated with computer-aided
detection.; Targeted ultrasound examination of the right breast was
performed.

[R CC synth-2D]
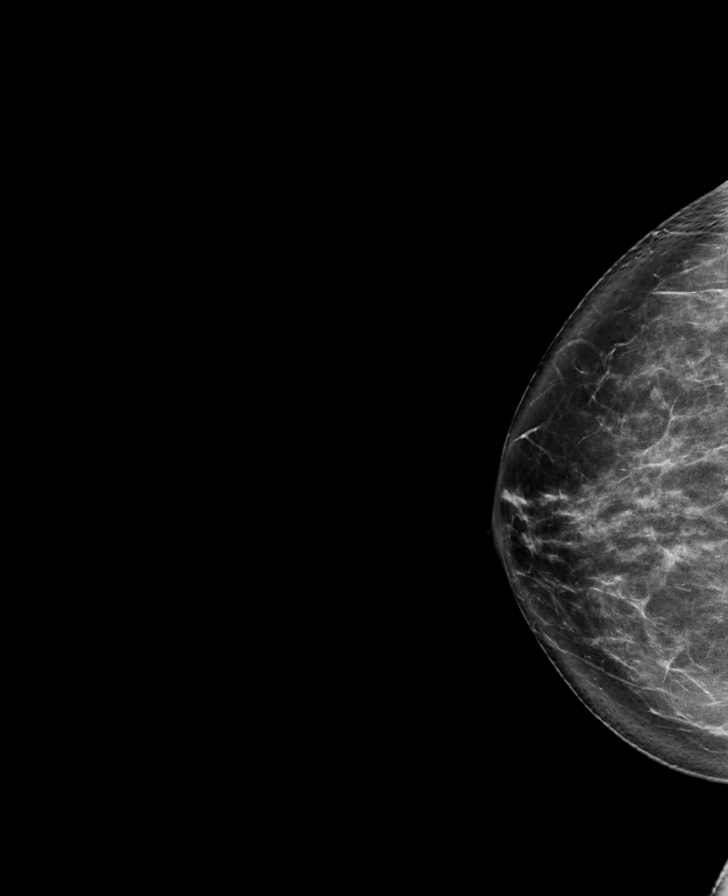

[R MLO synth-2D]
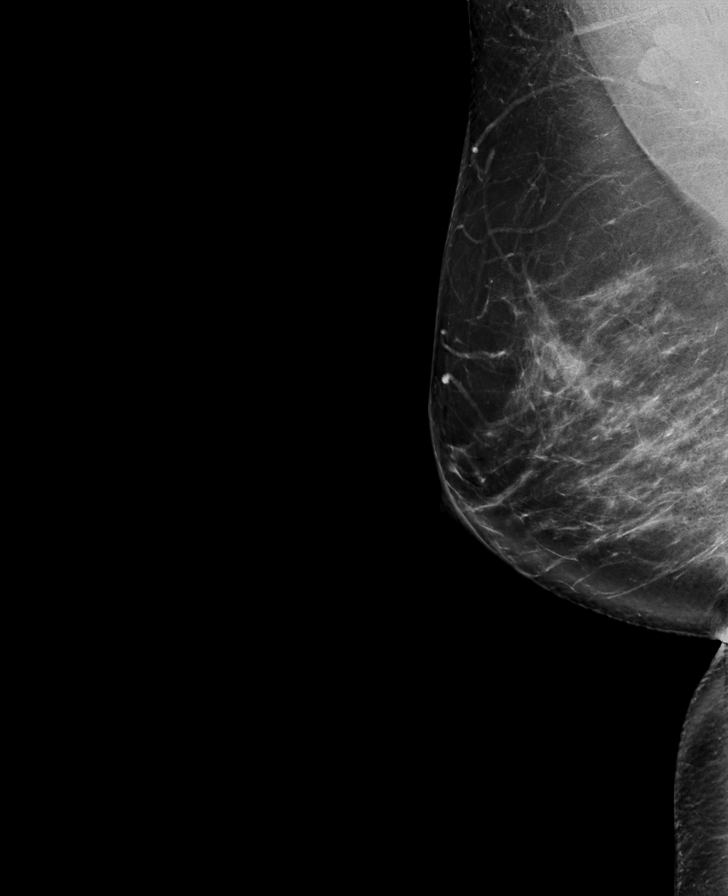

[L MLO synth-2D]
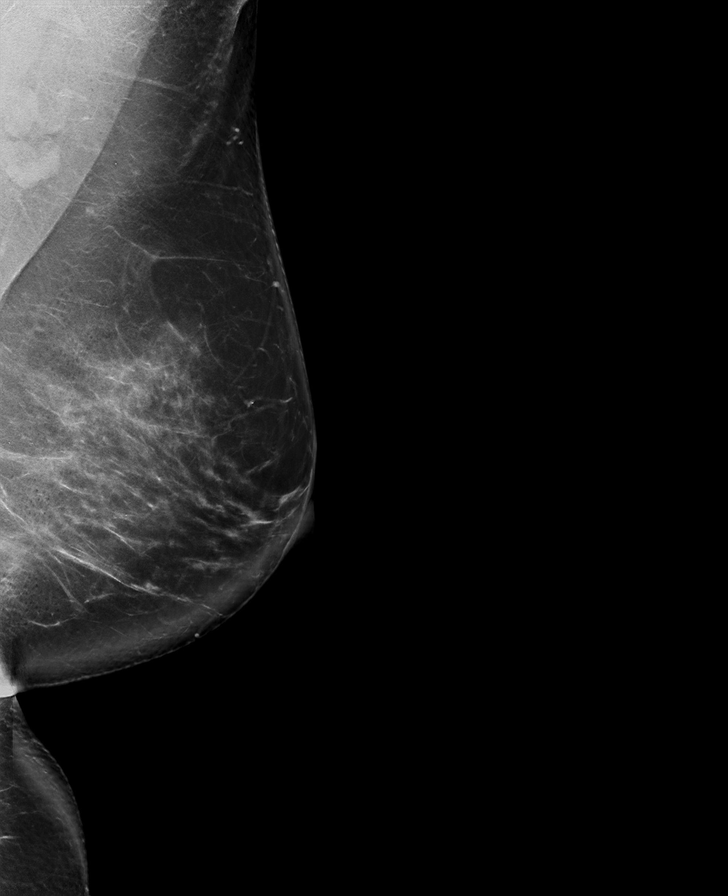

[L CC synth-2D]
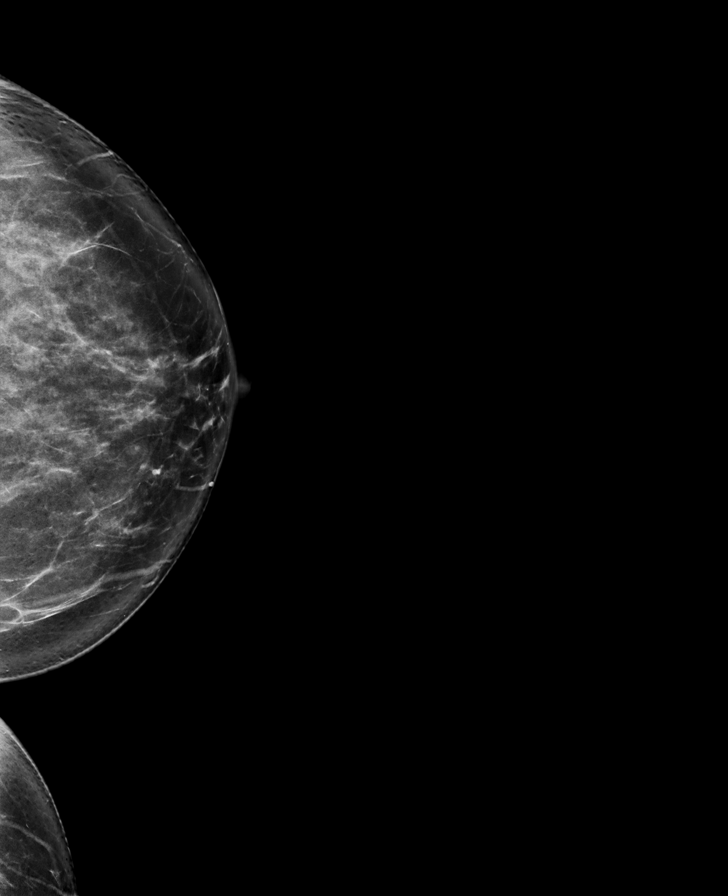

[R MLO tomo · tomo slice 50/99.0]
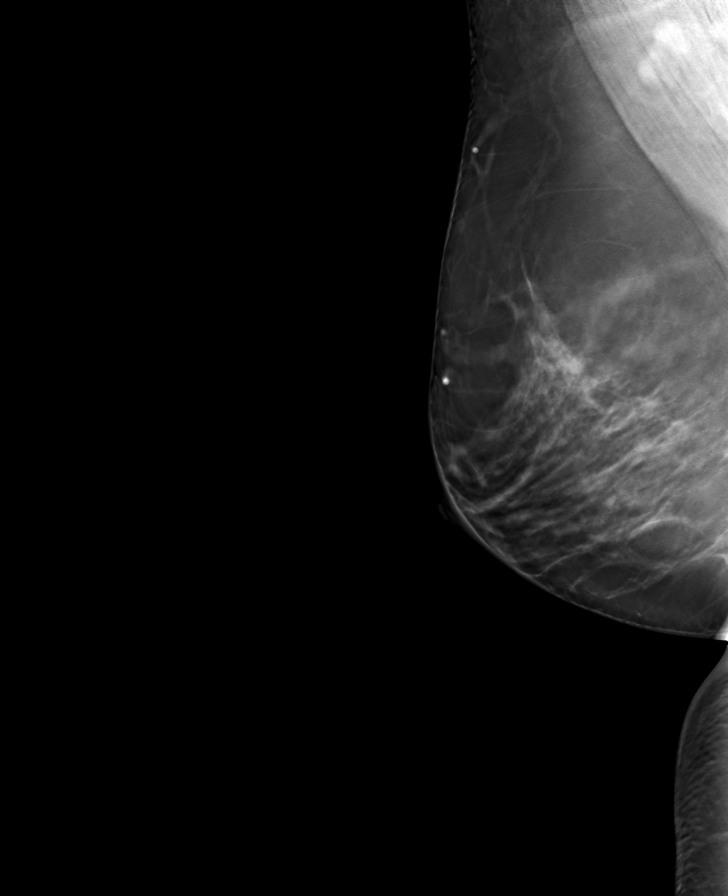

[L CC tomo · tomo slice 43/86.0]
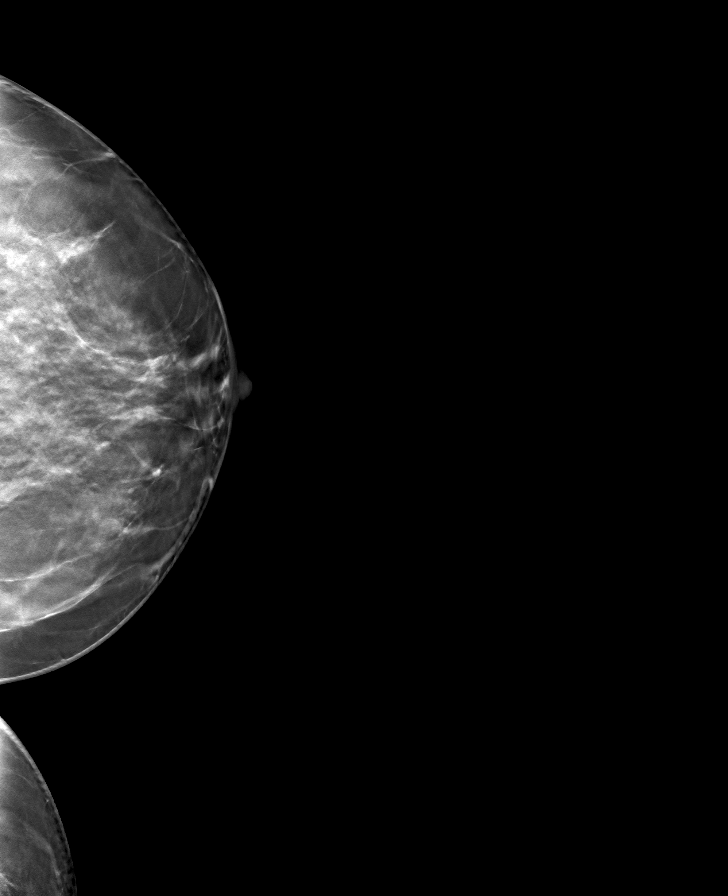

[R CC tomo · tomo slice 43/85.0]
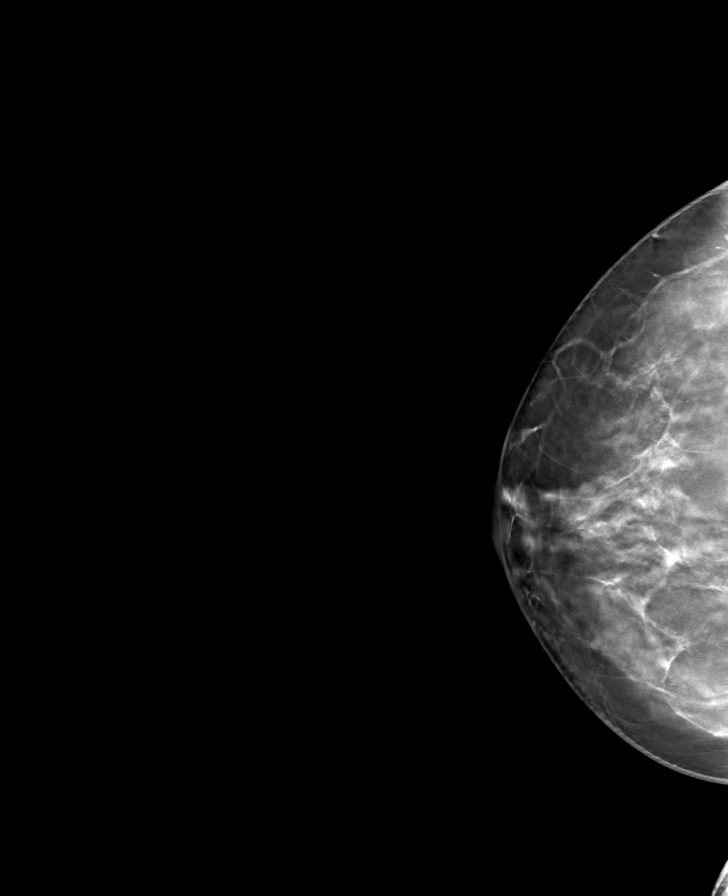

[L MLO tomo · tomo slice 55/109.0]
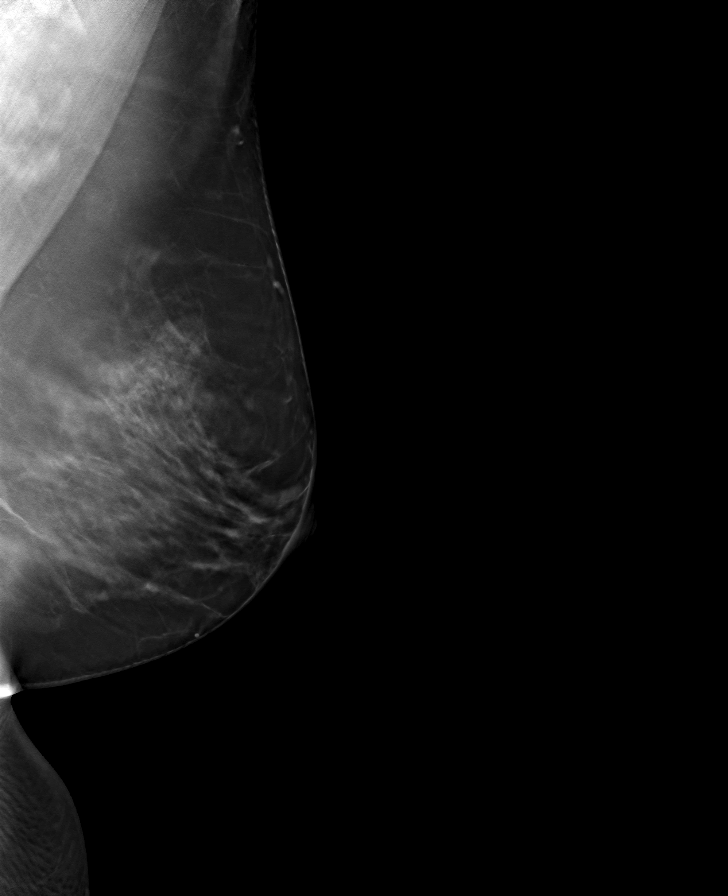

[8 of 24 positions shown; findings below may reference images not displayed]

ACR Breast Density Category c: The breast tissue is heterogeneously
dense, which may obscure small masses.
FINDINGS: Full field CC and MLO views of both breasts were obtained.

RIGHT: The circumscribed isodense mass measuring approximately 4 mm
is unchanged dating back to the May 2018 mammogram. No new or
suspicious findings elsewhere.

Targeted ultrasound is performed, again demonstrating the
circumscribed hypoechoic mass at the 9:30 o'clock position 3 cm from
the nipple at middle depth, measuring approximately 3 x 3 x 3 mm
(previously 4 x 3 x 3 mm on [DATE] [DATE], 5 x 4 x 4 mm on
06/17/2019, 5 x 4 x 5 mm on 05/20/2018), demonstrating no posterior
characteristics and no internal power Doppler flow.

LEFT: No findings suspicious for malignancy.
IMPRESSION: 1. No mammographic or sonographic evidence of malignancy involving
the RIGHT breast.
2. No mammographic evidence of malignancy involving the LEFT breast.
3. Stable 4 mm mass in the UPPER OUTER QUADRANT of the RIGHT breast
at 9:30 o'clock 3 cm from the nipple, likely a complicated cyst or
fibroadenoma, dating back to May 2018, confirming benignity.

RECOMMENDATION:
Screening mammogram in one year.(Code:RD-2-51I)

I have discussed the findings and recommendations with the patient.
If applicable, a reminder letter will be sent to the patient
regarding the next appointment.

BI-RADS CATEGORY  2: Benign.

## 2024-01-16 ENCOUNTER — Other Ambulatory Visit: Payer: Self-pay | Admitting: Unknown Physician Specialty

## 2024-01-16 DIAGNOSIS — H93A2 Pulsatile tinnitus, left ear: Secondary | ICD-10-CM

## 2024-01-20 ENCOUNTER — Ambulatory Visit
Admission: RE | Admit: 2024-01-20 | Discharge: 2024-01-20 | Discharge status: Home / Self Care | Source: Ambulatory Visit | Attending: Unknown Physician Specialty | Admitting: Unknown Physician Specialty

## 2024-01-20 DIAGNOSIS — H93A2 Pulsatile tinnitus, left ear: Secondary | ICD-10-CM

## 2024-02-02 ENCOUNTER — Other Ambulatory Visit: Payer: Self-pay | Admitting: Unknown Physician Specialty

## 2024-02-02 DIAGNOSIS — H93A2 Pulsatile tinnitus, left ear: Secondary | ICD-10-CM

## 2024-02-04 ENCOUNTER — Ambulatory Visit
Admission: RE | Admit: 2024-02-04 | Discharge: 2024-02-04 | Disposition: A | Discharge status: Home / Self Care | Source: Ambulatory Visit | Attending: Unknown Physician Specialty | Admitting: Unknown Physician Specialty

## 2024-02-04 DIAGNOSIS — H93A2 Pulsatile tinnitus, left ear: Secondary | ICD-10-CM

## 2024-02-09 ENCOUNTER — Other Ambulatory Visit: Payer: Self-pay | Admitting: Unknown Physician Specialty

## 2024-02-09 DIAGNOSIS — E041 Nontoxic single thyroid nodule: Secondary | ICD-10-CM

## 2024-02-10 ENCOUNTER — Inpatient Hospital Stay
Admission: RE | Admit: 2024-02-10 | Discharge: 2024-02-10 | Discharge status: Home / Self Care | Source: Ambulatory Visit | Attending: Unknown Physician Specialty | Admitting: Unknown Physician Specialty

## 2024-02-10 DIAGNOSIS — E041 Nontoxic single thyroid nodule: Secondary | ICD-10-CM

## 2024-02-13 ENCOUNTER — Other Ambulatory Visit: Payer: Self-pay | Admitting: Unknown Physician Specialty

## 2024-02-13 DIAGNOSIS — E041 Nontoxic single thyroid nodule: Secondary | ICD-10-CM

## 2024-02-13 NOTE — Progress Notes (Signed)
 Patient for US  guided FNA Mid LT Thyroid  Nodule Biopsy on Monday 02/16/24, I called and spoke with the patient on the phone and gave pre-procedure instructions. Pt was made aware to be here at 12:30p and check in at the [facility] registration desk. Pt stated understanding.  Called 02/13/24

## 2024-02-16 ENCOUNTER — Ambulatory Visit
Admission: RE | Admit: 2024-02-16 | Discharge: 2024-02-16 | Disposition: A | Discharge status: Home / Self Care | Source: Ambulatory Visit | Attending: Unknown Physician Specialty | Admitting: Unknown Physician Specialty

## 2024-02-16 VITALS — BP 153/83 | HR 67

## 2024-02-16 DIAGNOSIS — E041 Nontoxic single thyroid nodule: Secondary | ICD-10-CM | POA: Diagnosis present

## 2024-02-16 MED ORDER — LIDOCAINE HCL (PF) 1 % IJ SOLN
10.0000 mL | Freq: Once | INTRAMUSCULAR | Status: AC
  Administered 2024-02-16: 10 mL via INTRADERMAL
  Filled 2024-02-16: qty 10

## 2024-02-18 LAB — CYTOLOGY - NON PAP

## 2024-02-20 ENCOUNTER — Other Ambulatory Visit: Payer: Self-pay | Admitting: Unknown Physician Specialty

## 2024-02-20 DIAGNOSIS — E041 Nontoxic single thyroid nodule: Secondary | ICD-10-CM
